# Patient Record
Sex: Male | Born: 1980
Health system: Southern US, Community
[De-identification: ages and names within clinical notes are randomized; demographics above are authoritative.]

## PROBLEM LIST (undated history)

## (undated) ENCOUNTER — Emergency Department (HOSPITAL_COMMUNITY): Payer: Self-pay

## (undated) DIAGNOSIS — Z21 Asymptomatic human immunodeficiency virus [HIV] infection status: Secondary | ICD-10-CM

## (undated) DIAGNOSIS — I10 Essential (primary) hypertension: Secondary | ICD-10-CM

## (undated) DIAGNOSIS — B20 Human immunodeficiency virus [HIV] disease: Secondary | ICD-10-CM

## (undated) DIAGNOSIS — I319 Disease of pericardium, unspecified: Secondary | ICD-10-CM

## (undated) DIAGNOSIS — A072 Cryptosporidiosis: Secondary | ICD-10-CM

## (undated) DIAGNOSIS — R778 Other specified abnormalities of plasma proteins: Secondary | ICD-10-CM

## (undated) DIAGNOSIS — R7989 Other specified abnormal findings of blood chemistry: Secondary | ICD-10-CM

## (undated) DIAGNOSIS — M1990 Unspecified osteoarthritis, unspecified site: Secondary | ICD-10-CM

## (undated) HISTORY — DX: Disease of pericardium, unspecified: I31.9

## (undated) HISTORY — DX: Human immunodeficiency virus (HIV) disease: B20

## (undated) HISTORY — DX: Unspecified osteoarthritis, unspecified site: M19.90

## (undated) HISTORY — PX: KNEE ARTHROSCOPY: SHX127

## (undated) HISTORY — DX: Other specified abnormalities of plasma proteins: R77.8

## (undated) HISTORY — DX: Cryptosporidiosis: A07.2

## (undated) HISTORY — DX: Essential (primary) hypertension: I10

## (undated) HISTORY — DX: Asymptomatic human immunodeficiency virus (hiv) infection status: Z21

## (undated) HISTORY — DX: Other specified abnormal findings of blood chemistry: R79.89

---

## 2006-01-15 ENCOUNTER — Ambulatory Visit: Payer: Self-pay | Admitting: Unknown Physician Specialty

## 2007-05-20 ENCOUNTER — Encounter: Payer: Self-pay | Admitting: Infectious Disease

## 2007-05-20 LAB — CONVERTED CEMR LAB

## 2008-02-06 ENCOUNTER — Encounter: Payer: Self-pay | Admitting: Infectious Disease

## 2008-02-06 ENCOUNTER — Ambulatory Visit: Payer: Self-pay | Admitting: Infectious Diseases

## 2008-02-06 LAB — CONVERTED CEMR LAB
CD4 Count: 394 microliters
HCV Ab: NEGATIVE
HIV 1 RNA Quant: 6490 copies/mL
RPR Ser Ql: NONREACTIVE

## 2008-06-01 ENCOUNTER — Encounter: Payer: Self-pay | Admitting: Infectious Disease

## 2008-06-01 LAB — CONVERTED CEMR LAB
CD4 Count: 299 microliters
Cholesterol: 178 mg/dL
Creatinine, Ser: 0.82 mg/dL
Glucose, Bld: 89 mg/dL
HIV 1 RNA Quant: 19120 copies/mL
Triglyceride fasting, serum: 91 mg/dL

## 2008-06-11 ENCOUNTER — Ambulatory Visit: Payer: Self-pay | Admitting: Infectious Diseases

## 2008-08-06 ENCOUNTER — Ambulatory Visit: Payer: Self-pay | Admitting: Infectious Disease

## 2008-08-06 LAB — CONVERTED CEMR LAB
CD4 T Helper %: 17.4 %
Cholesterol: 176 mg/dL
HIV 1 RNA Quant: 18810 copies/mL
Triglyceride fasting, serum: 277 mg/dL

## 2008-09-17 ENCOUNTER — Ambulatory Visit: Payer: Self-pay | Admitting: Internal Medicine

## 2008-12-14 ENCOUNTER — Encounter: Payer: Self-pay | Admitting: Infectious Disease

## 2008-12-14 LAB — CONVERTED CEMR LAB
CD4 Count: 421 microliters
CD4 T Helper %: 23.4 %
Creatinine, Ser: 0.94 mg/dL
Glucose, Bld: 107 mg/dL
Glucose, Bld: 98 mg/dL
HIV 1 RNA Quant: 290 copies/mL
Hemoglobin: 15.5 g/dL
Hemoglobin: 16.4 g/dL
Platelets: 203 10*3/uL

## 2008-12-25 ENCOUNTER — Ambulatory Visit: Payer: Self-pay | Admitting: Internal Medicine

## 2009-05-31 ENCOUNTER — Encounter: Payer: Self-pay | Admitting: Infectious Disease

## 2009-05-31 LAB — CONVERTED CEMR LAB
CD4 Count: 443 microliters
CD4 T Helper %: 21.1 %
Glucose, Bld: 93 mg/dL
HIV 1 RNA Quant: <20 copies/mL
Hemoglobin: 15.5 g/dL

## 2009-07-08 ENCOUNTER — Ambulatory Visit: Payer: Self-pay | Admitting: Infectious Diseases

## 2009-09-13 ENCOUNTER — Ambulatory Visit: Payer: Self-pay | Admitting: Internal Medicine

## 2009-11-05 ENCOUNTER — Ambulatory Visit: Payer: Self-pay | Admitting: Infectious Diseases

## 2009-11-05 ENCOUNTER — Encounter: Payer: Self-pay | Admitting: Infectious Disease

## 2009-11-05 LAB — CONVERTED CEMR LAB
CD4 Count: 448 microliters
CD4 T Helper %: 23.6 %
Glucose, Bld: 91 mg/dL
HIV 1 RNA Quant: <20 copies/mL
Hemoglobin: 15.8 g/dL

## 2010-04-25 ENCOUNTER — Encounter: Payer: Self-pay | Admitting: Infectious Disease

## 2010-04-25 LAB — CONVERTED CEMR LAB
CD4 Count: 588 microliters
CD4 T Helper %: 24.8 %
Hemoglobin: 15.6 g/dL

## 2010-06-24 ENCOUNTER — Encounter: Payer: Self-pay | Admitting: Infectious Disease

## 2010-08-15 ENCOUNTER — Other Ambulatory Visit (INDEPENDENT_AMBULATORY_CARE_PROVIDER_SITE_OTHER): Payer: Self-pay

## 2010-08-15 ENCOUNTER — Encounter: Payer: Self-pay | Admitting: Infectious Disease

## 2010-08-15 ENCOUNTER — Other Ambulatory Visit: Payer: Self-pay | Admitting: Infectious Disease

## 2010-08-15 DIAGNOSIS — B20 Human immunodeficiency virus [HIV] disease: Secondary | ICD-10-CM | POA: Insufficient documentation

## 2010-08-15 DIAGNOSIS — F4323 Adjustment disorder with mixed anxiety and depressed mood: Secondary | ICD-10-CM | POA: Insufficient documentation

## 2010-08-15 DIAGNOSIS — Z9889 Other specified postprocedural states: Secondary | ICD-10-CM | POA: Insufficient documentation

## 2010-08-15 LAB — CONVERTED CEMR LAB
ALT: 43 units/L (ref 0–53)
AST: 23 units/L (ref 0–37)
Albumin: 4.5 g/dL (ref 3.5–5.2)
Basophils Absolute: 0 10*3/uL (ref 0.0–0.1)
CO2: 27 meq/L (ref 19–32)
Calcium: 9.3 mg/dL (ref 8.4–10.5)
Chlamydia, Swab/Urine, PCR: NEGATIVE
Chloride: 103 meq/L (ref 96–112)
Cholesterol: 191 mg/dL (ref 0–200)
Creatinine, Ser: 1.03 mg/dL (ref 0.40–1.50)
Eosinophils Relative: 1 % (ref 0–5)
HCT: 45.1 % (ref 39.0–52.0)
HDL: 52 mg/dL (ref >39)
Lymphocytes Relative: 26 % (ref 12–46)
Lymphs Abs: 1.6 10*3/uL (ref 0.7–4.0)
Neutro Abs: 4 10*3/uL (ref 1.7–7.7)
Neutrophils Relative %: 65 % (ref 43–77)
Platelets: 201 10*3/uL (ref 150–400)
Potassium: 4 meq/L (ref 3.5–5.3)
RDW: 12.9 % (ref 11.5–15.5)
Total CHOL/HDL Ratio: 3.7
Triglycerides: 115 mg/dL (ref <150)
VLDL: 23 mg/dL (ref 0–40)
WBC: 6.2 10*3/uL (ref 4.0–10.5)

## 2010-08-17 LAB — HIV-1 RNA QUANT-NO REFLEX-BLD: HIV-1 RNA Quant, Log: <1.3 {Log} (ref <1.30)

## 2010-09-07 ENCOUNTER — Ambulatory Visit (INDEPENDENT_AMBULATORY_CARE_PROVIDER_SITE_OTHER): Payer: PRIVATE HEALTH INSURANCE | Admitting: Infectious Disease

## 2010-09-07 DIAGNOSIS — F4323 Adjustment disorder with mixed anxiety and depressed mood: Secondary | ICD-10-CM

## 2010-09-07 DIAGNOSIS — B20 Human immunodeficiency virus [HIV] disease: Secondary | ICD-10-CM

## 2010-09-07 DIAGNOSIS — I10 Essential (primary) hypertension: Secondary | ICD-10-CM | POA: Insufficient documentation

## 2010-09-07 DIAGNOSIS — Z21 Asymptomatic human immunodeficiency virus [HIV] infection status: Secondary | ICD-10-CM

## 2010-09-07 MED ORDER — EFAVIRENZ-EMTRICITAB-TENOFOVIR 600-200-300 MG PO TABS: 1.0000 | ORAL_TABLET | Freq: Every day | ORAL | Status: DC

## 2010-09-07 MED ORDER — BENAZEPRIL HCL 10 MG PO TABS: 10.0000 mg | ORAL_TABLET | Freq: Every day | ORAL | Status: DC

## 2010-09-07 NOTE — Progress Notes (Signed)
  Subjective:    Patient ID: Hayden Hicks, male    DOB: [birth year], 30 y.o.   MRN: [id number]  HPI  Patient is 30 is a 30 year old Caucasian male with HIV that is supremely well-controlled on Atripla. His last viral load remains undetectable and his CD4 count is healthy and well over 400. He is highly compliant with his antiviral regimen has been so for many years. He is accompanied by his male partner who is HIV negative to the clinic. We reviewed safe sex practices. Reviewed data from [facility] [phone] which showed a low risk of transmission of HIV in patients who had undetectable viral loads. That being said I still warned patient against unsafe sex practices given the risk for transmission it still exists from into his partner although it is quite low. Furthermore I pointed out that the patient could be at risk for getting a secondary strain is resistant Atripla strain if he were to engage in [place] with multiple  partners. The patient otherwise is doing quite well today and had no other specific complaints. On review of his records from [place] he has had his influenza shot this year his Pneumovax is up-to-date he has had his hepatitis B vaccinations and hepatitis A vaccinations. We will be updating her record here at [place]. Review of Systems as per history of present illness otherwise 12 point review of systems is negative.    Objective:   Physical Exam    alert oriented x4 in no acute distress. HEENT normocephalic atraumatic pupils were equal and reactive to light sclera anicteric oropharynx clear dentition is healthy. His neck is supple his cardiovascular exam revealed regular rate and rhythm no murmurs rales rubs heard lungs are clear to auscultation bilaterally without wheezes or rales his abdomen is soft nondistended nontender. Extremities are without edema. Neurologically as a nonfocal exam. No rashes.    Assessment & Plan:  HIV INFECTION Supreme is well-controlled continue Atripla.  ADJ  DISORDER WITH MIXED ANXIETY & DEPRESSED MOOD He uses Klonopin intermittently for this.  HTN (hypertension) I refilled his Lotensin.

## 2010-09-07 NOTE — Patient Instructions (Signed)
Excellent JOB!! RTC in October with labs (fasting) and visit for flu shot and MD visit

## 2010-09-07 NOTE — Assessment & Plan Note (Signed)
He uses Klonopin intermittently for this.

## 2010-09-07 NOTE — Assessment & Plan Note (Signed)
Supreme is well-controlled continue Atripla.

## 2010-09-07 NOTE — Assessment & Plan Note (Signed)
I refilled his Lotensin.

## 2010-09-13 ENCOUNTER — Encounter: Payer: Self-pay | Admitting: Licensed Clinical Social Worker

## 2010-10-13 ENCOUNTER — Encounter: Payer: Self-pay | Admitting: Infectious Disease

## 2010-10-13 ENCOUNTER — Encounter: Payer: Self-pay | Admitting: Internal Medicine

## 2010-10-13 ENCOUNTER — Encounter: Payer: Self-pay | Admitting: Infectious Diseases

## 2011-02-06 ENCOUNTER — Other Ambulatory Visit (INDEPENDENT_AMBULATORY_CARE_PROVIDER_SITE_OTHER): Payer: PRIVATE HEALTH INSURANCE

## 2011-02-06 DIAGNOSIS — Z23 Encounter for immunization: Secondary | ICD-10-CM

## 2011-02-06 DIAGNOSIS — Z113 Encounter for screening for infections with a predominantly sexual mode of transmission: Secondary | ICD-10-CM

## 2011-02-06 DIAGNOSIS — Z79899 Other long term (current) drug therapy: Secondary | ICD-10-CM

## 2011-02-06 DIAGNOSIS — Z21 Asymptomatic human immunodeficiency virus [HIV] infection status: Secondary | ICD-10-CM

## 2011-02-06 DIAGNOSIS — B20 Human immunodeficiency virus [HIV] disease: Secondary | ICD-10-CM

## 2011-02-06 LAB — COMPREHENSIVE METABOLIC PANEL
AST: 37 U/L (ref 0–37)
BUN: 14 mg/dL (ref 6–23)
Calcium: 10 mg/dL (ref 8.4–10.5)
Chloride: 98 mEq/L (ref 96–112)
Creat: 1.07 mg/dL (ref 0.50–1.35)

## 2011-02-06 LAB — CBC WITH DIFFERENTIAL/PLATELET
Basophils Absolute: 0 10*3/uL (ref 0.0–0.1)
Eosinophils Relative: 2 % (ref 0–5)
HCT: 50.4 % (ref 39.0–52.0)
Lymphocytes Relative: 36 % (ref 12–46)
MCH: 32.7 pg (ref 26.0–34.0)
MCHC: 36.1 g/dL — ABNORMAL HIGH (ref 30.0–36.0)
MCV: 90.5 fL (ref 78.0–100.0)
Monocytes Absolute: 0.5 10*3/uL (ref 0.1–1.0)
RDW: 12.5 % (ref 11.5–15.5)
WBC: 7 10*3/uL (ref 4.0–10.5)

## 2011-02-06 LAB — RPR

## 2011-02-07 LAB — T-HELPER CELL (CD4) - (RCID CLINIC ONLY)
CD4 % Helper T Cell: 31 % — ABNORMAL LOW (ref 33–55)
CD4 T Cell Abs: 820 uL (ref 400–2700)

## 2011-02-07 LAB — GC PROBE AMPLIFICATION, URINE: GC Probe Amp, Urine: NEGATIVE

## 2011-02-08 LAB — HIV-1 RNA QUANT-NO REFLEX-BLD: HIV 1 RNA Quant: 30 copies/mL — ABNORMAL HIGH (ref <20)

## 2011-02-20 ENCOUNTER — Ambulatory Visit (INDEPENDENT_AMBULATORY_CARE_PROVIDER_SITE_OTHER): Payer: PRIVATE HEALTH INSURANCE | Admitting: Infectious Disease

## 2011-02-20 ENCOUNTER — Encounter: Payer: Self-pay | Admitting: Infectious Disease

## 2011-02-20 VITALS — BP 166/97 | HR 77 | Temp 98.0°F | Wt 202.0 lb

## 2011-02-20 DIAGNOSIS — I10 Essential (primary) hypertension: Secondary | ICD-10-CM

## 2011-02-20 DIAGNOSIS — R6889 Other general symptoms and signs: Secondary | ICD-10-CM

## 2011-02-20 DIAGNOSIS — F4323 Adjustment disorder with mixed anxiety and depressed mood: Secondary | ICD-10-CM

## 2011-02-20 DIAGNOSIS — D751 Secondary polycythemia: Secondary | ICD-10-CM

## 2011-02-20 DIAGNOSIS — B20 Human immunodeficiency virus [HIV] disease: Secondary | ICD-10-CM

## 2011-02-20 DIAGNOSIS — R196 Halitosis: Secondary | ICD-10-CM

## 2011-02-20 LAB — MICROALBUMIN / CREATININE URINE RATIO
Creatinine, Urine: 243.2 mg/dL
Microalb Creat Ratio: 30.6 mg/g — ABNORMAL HIGH (ref 0.0–30.0)
Microalb, Ur: 7.45 mg/dL — ABNORMAL HIGH (ref 0.00–1.89)

## 2011-02-20 LAB — HEMOGLOBIN A1C: Mean Plasma Glucose: 105 mg/dL (ref <117)

## 2011-02-20 MED ORDER — AMLODIPINE BESY-BENAZEPRIL HCL 5-10 MG PO CAPS: 1.0000 | ORAL_CAPSULE | Freq: Every day | ORAL | Status: DC

## 2011-02-20 NOTE — Progress Notes (Signed)
Subjective:    Patient ID: Hayden Hicks, male    DOB: [birth year], 30 y.o.   MRN: [id number]  HPI Hayden Hicks is a 30 year old Caucasian male HIV the supreme is well-controlled with Atripla. Accompanied today by his male partner who is HIV negative per history. They endorse using condoms with all forms of sexual intercourse. We spent a great deal of time discussing and multitude of issues today.  #1 patient complains of flulike symptoms after having his influenza vaccine he had symptoms of myalgias fevers malaise and some coarse voice. I informed him that this possibly could have been related to his body showing an immune response to the inactivated flu virus antigens or it could be a coincidental upper respiratory viral infection that is now resolved. Encouraged him to continue getting his flu shots.  #2 we examined his labs and found that he had elevated hemoglobin 18. This is isolated he is not smoking he is on testosterone replacement workup we will recheck these labs.  #3 review his blood pressure which was in the 160s over 90s range I'm going to change him to amlodipine/benazepril.  #4 review his cholesterol and had been not fasting today so able to keep this in mind in terms of interpreting the data.  #5 he is at some bills and into the clinic was related to time period covered by a And didn't have insurance was referred to   In total we spent greater than 45 minutes greater than 50% of time in face-to-face counseling the patient and coordination of his care.   Review of Systems  Constitutional: Negative for fever, chills, diaphoresis, activity change, appetite change, fatigue and unexpected weight change.  HENT: Negative for congestion, sore throat, rhinorrhea, sneezing, trouble swallowing and sinus pressure.   Eyes: Negative for photophobia and visual disturbance.  Respiratory: Negative for cough, chest tightness, shortness of breath, wheezing and stridor.   Cardiovascular:  Negative for chest pain, palpitations and leg swelling.  Gastrointestinal: Negative for nausea, vomiting, abdominal pain, diarrhea, constipation, blood in stool, abdominal distention and anal bleeding.  Genitourinary: Negative for dysuria, hematuria, flank pain and difficulty urinating.  Musculoskeletal: Negative for myalgias, back pain, joint swelling, arthralgias and gait problem.  Skin: Negative for color change, pallor, rash and wound.  Neurological: Negative for dizziness, tremors, weakness and light-headedness.  Hematological: Negative for adenopathy. Does not bruise/bleed easily.  Psychiatric/Behavioral: Negative for behavioral problems, confusion, sleep disturbance, dysphoric mood, decreased concentration and agitation.       Objective:   Physical Exam  Constitutional: He is oriented to person, place, and time. He appears well-developed and well-nourished. No distress.  HENT:  Head: Normocephalic and atraumatic.  Mouth/Throat: Oropharynx is clear and moist. No oropharyngeal exudate.  Eyes: Conjunctivae and EOM are normal. Pupils are equal, round, and reactive to light. No scleral icterus.  Neck: Normal range of motion. Neck supple. No JVD present.  Cardiovascular: Normal rate, regular rhythm and normal heart sounds.  Exam reveals no gallop and no friction rub.   No murmur heard. Pulmonary/Chest: Effort normal and breath sounds normal. No respiratory distress. He has no wheezes. He has no rales. He exhibits no tenderness.  Abdominal: He exhibits no distension and no mass. There is no tenderness. There is no rebound and no guarding.  Musculoskeletal: He exhibits no edema and no tenderness.  Lymphadenopathy:    He has no cervical adenopathy.  Neurological: He is alert and oriented to person, place, and time. He has normal reflexes. He exhibits normal  muscle tone. Coordination normal.  Skin: Skin is warm and dry. He is not diaphoretic. No erythema. No pallor.  Psychiatric: He has a  normal mood and affect. His behavior is normal. Judgment and thought content normal.          Assessment & Plan:  HIV INFECTION Superb control continue Atripla we'll check an [id number] will be checked as well  HTN (hypertension) Not well controlled change to lotrel. Check his microalbumin and creatinine ratio  Polycythemia Only one isolated blood test with high hgb. No smoking no testosteroen replacement. WIll recheck labs today  ADJ DISORDER WITH MIXED ANXIETY & DEPRESSED MOOD stable  Bad breath No new caries on exam but should see dentist in any case  Flu-like symptoms Could have been related to influenza vaccine more likely coincidental URI

## 2011-02-20 NOTE — Assessment & Plan Note (Signed)
Could have been related to influenza vaccine more likely coincidental URI

## 2011-02-20 NOTE — Assessment & Plan Note (Signed)
No new caries on exam but should see dentist in any case

## 2011-02-20 NOTE — Assessment & Plan Note (Signed)
Superb control continue Atripla we'll check an HLA-[id number] will be checked as well

## 2011-02-20 NOTE — Assessment & Plan Note (Signed)
Only one isolated blood test with high hgb. No smoking no testosteroen replacement. WIll recheck labs today

## 2011-02-20 NOTE — Assessment & Plan Note (Addendum)
Not well controlled change to lotrel. Check his microalbumin and creatinine ratio

## 2011-02-20 NOTE — Assessment & Plan Note (Signed)
stable °

## 2011-03-01 LAB — HLA B*5701: HLA-B*5701: NEGATIVE

## 2011-08-09 ENCOUNTER — Other Ambulatory Visit: Payer: PRIVATE HEALTH INSURANCE

## 2011-08-09 DIAGNOSIS — B20 Human immunodeficiency virus [HIV] disease: Secondary | ICD-10-CM

## 2011-08-09 LAB — COMPLETE METABOLIC PANEL WITH GFR
ALT: 44 U/L (ref 0–53)
AST: 26 U/L (ref 0–37)
Calcium: 9.4 mg/dL (ref 8.4–10.5)
Chloride: 103 mEq/L (ref 96–112)
Creat: 0.98 mg/dL (ref 0.50–1.35)
Potassium: 3.8 mEq/L (ref 3.5–5.3)
Sodium: 140 mEq/L (ref 135–145)
Total Protein: 6.9 g/dL (ref 6.0–8.3)

## 2011-08-09 LAB — CBC WITH DIFFERENTIAL/PLATELET
Eosinophils Absolute: 0.2 10*3/uL (ref 0.0–0.7)
Eosinophils Relative: 3 % (ref 0–5)
Lymphs Abs: 3 10*3/uL (ref 0.7–4.0)
MCH: 32.6 pg (ref 26.0–34.0)
MCV: 89.7 fL (ref 78.0–100.0)
Monocytes Relative: 7 % (ref 3–12)
Platelets: 201 10*3/uL (ref 150–400)
RBC: 5.15 MIL/uL (ref 4.22–5.81)

## 2011-08-11 LAB — HIV-1 RNA ULTRAQUANT REFLEX TO GENTYP+
HIV 1 RNA Quant: <20 copies/mL (ref <20)
HIV-1 RNA Quant, Log: <1.3 {Log} (ref <1.30)

## 2011-08-23 ENCOUNTER — Ambulatory Visit (INDEPENDENT_AMBULATORY_CARE_PROVIDER_SITE_OTHER): Payer: PRIVATE HEALTH INSURANCE | Admitting: Infectious Disease

## 2011-08-23 ENCOUNTER — Encounter: Payer: Self-pay | Admitting: Infectious Disease

## 2011-08-23 VITALS — BP 139/88 | HR 84 | Temp 98.3°F | Wt 195.0 lb

## 2011-08-23 DIAGNOSIS — B20 Human immunodeficiency virus [HIV] disease: Secondary | ICD-10-CM

## 2011-08-23 DIAGNOSIS — R196 Halitosis: Secondary | ICD-10-CM

## 2011-08-23 DIAGNOSIS — I10 Essential (primary) hypertension: Secondary | ICD-10-CM

## 2011-08-23 DIAGNOSIS — R6889 Other general symptoms and signs: Secondary | ICD-10-CM

## 2011-08-23 DIAGNOSIS — Z0000 Encounter for general adult medical examination without abnormal findings: Secondary | ICD-10-CM

## 2011-08-23 NOTE — Assessment & Plan Note (Signed)
Performed and form filled out for the pt

## 2011-08-23 NOTE — Assessment & Plan Note (Signed)
Perfect control 

## 2011-08-23 NOTE — Assessment & Plan Note (Signed)
Partner complained of pt having problems with gas today. Can try to alter diet and make  Sure dental checkups todate.

## 2011-08-23 NOTE — Progress Notes (Signed)
  Subjective:    Patient ID: Hayden Hicks, male    DOB: [birth year], 31 y.o.   MRN: [id number]  HPI  Hayden Hicks is a 31 y.o. male who is doing superbly well on his  antiviral regimen, with undetectable viral load and health cd4 count that is now near 1000. His BP is better controlled. Lipids were not rechecked after trigylcerides were high with NON fasting lipid panel. He presents here with his partner. He is in need of an annual physical for work.Review of Systems  Constitutional: Negative for fever, chills, diaphoresis, activity change, appetite change, fatigue and unexpected weight change.  HENT: Negative for congestion, sore throat, rhinorrhea, sneezing, trouble swallowing and sinus pressure.   Eyes: Negative for photophobia and visual disturbance.  Respiratory: Negative for cough, chest tightness, shortness of breath, wheezing and stridor.   Cardiovascular: Negative for chest pain, palpitations and leg swelling.  Gastrointestinal: Negative for nausea, vomiting, abdominal pain, diarrhea, constipation, blood in stool, abdominal distention and anal bleeding.  Genitourinary: Negative for dysuria, hematuria, flank pain and difficulty urinating.  Musculoskeletal: Negative for myalgias, back pain, joint swelling, arthralgias and gait problem.  Skin: Negative for color change, pallor, rash and wound.  Neurological: Negative for dizziness, tremors, weakness and light-headedness.  Hematological: Negative for adenopathy. Does not bruise/bleed easily.  Psychiatric/Behavioral: Negative for behavioral problems, confusion, sleep disturbance, dysphoric mood, decreased concentration and agitation.       Objective:   Physical Exam  Constitutional: He is oriented to person, place, and time. He appears well-developed and well-nourished. No distress.  HENT:  Head: Normocephalic and atraumatic.  Mouth/Throat: Oropharynx is clear and moist. No oropharyngeal exudate.  Eyes: Conjunctivae and EOM are  normal. Pupils are equal, round, and reactive to light. No scleral icterus.  Neck: Normal range of motion. Neck supple. No JVD present.  Cardiovascular: Normal rate, regular rhythm and normal heart sounds.  Exam reveals no gallop and no friction rub.   No murmur heard. Pulmonary/Chest: Effort normal and breath sounds normal. No respiratory distress. He has no wheezes. He has no rales. He exhibits no tenderness.  Abdominal: He exhibits no distension and no mass. There is no tenderness. There is no rebound and no guarding.  Musculoskeletal: He exhibits no edema and no tenderness.  Lymphadenopathy:       Head (right side): No submental, no submandibular, no preauricular and no posterior auricular adenopathy present.       Head (left side): No submental, no submandibular, no preauricular and no posterior auricular adenopathy present.    He has no cervical adenopathy.    He has no axillary adenopathy.  Neurological: He is alert and oriented to person, place, and time. He has normal reflexes. No cranial nerve deficit or sensory deficit. He exhibits normal muscle tone. Coordination normal.  Skin: Skin is warm and dry. No abrasion, no bruising and no rash noted. He is not diaphoretic. No cyanosis or erythema. No pallor. Nails show no clubbing.  Psychiatric: He has a normal mood and affect. His behavior is normal. Judgment and thought content normal.          Assessment & Plan:  HIV INFECTION Perfect control  HTN (hypertension) Better control on lotrel  Bad breath Partner complained of pt having problems with gas today. Can try to alter diet and make  Sure dental checkups todate.  Annual physical exam Performed and form filled out for the pt

## 2011-08-23 NOTE — Assessment & Plan Note (Signed)
Better control on lotrel

## 2011-09-15 ENCOUNTER — Other Ambulatory Visit: Payer: Self-pay | Admitting: Licensed Clinical Social Worker

## 2011-09-15 ENCOUNTER — Other Ambulatory Visit: Payer: Self-pay | Admitting: Infectious Disease

## 2011-09-15 DIAGNOSIS — B20 Human immunodeficiency virus [HIV] disease: Secondary | ICD-10-CM

## 2011-09-15 MED ORDER — EFAVIRENZ-EMTRICITAB-TENOFOVIR 600-200-300 MG PO TABS: 1.0000 | ORAL_TABLET | Freq: Every day | ORAL | Status: DC

## 2012-01-08 ENCOUNTER — Other Ambulatory Visit: Payer: Self-pay | Admitting: Licensed Clinical Social Worker

## 2012-01-08 DIAGNOSIS — I10 Essential (primary) hypertension: Secondary | ICD-10-CM

## 2012-01-08 MED ORDER — AMLODIPINE BESY-BENAZEPRIL HCL 5-10 MG PO CAPS: 1.0000 | ORAL_CAPSULE | Freq: Every day | ORAL | Status: DC

## 2012-02-05 ENCOUNTER — Other Ambulatory Visit: Payer: Self-pay | Admitting: Infectious Disease

## 2012-02-05 DIAGNOSIS — Z113 Encounter for screening for infections with a predominantly sexual mode of transmission: Secondary | ICD-10-CM

## 2012-02-07 ENCOUNTER — Other Ambulatory Visit: Payer: PRIVATE HEALTH INSURANCE

## 2012-02-21 ENCOUNTER — Ambulatory Visit: Payer: PRIVATE HEALTH INSURANCE | Admitting: Infectious Disease

## 2012-02-21 ENCOUNTER — Other Ambulatory Visit (INDEPENDENT_AMBULATORY_CARE_PROVIDER_SITE_OTHER): Payer: PRIVATE HEALTH INSURANCE

## 2012-02-21 DIAGNOSIS — B20 Human immunodeficiency virus [HIV] disease: Secondary | ICD-10-CM

## 2012-02-21 DIAGNOSIS — Z113 Encounter for screening for infections with a predominantly sexual mode of transmission: Secondary | ICD-10-CM

## 2012-02-21 LAB — CBC WITH DIFFERENTIAL/PLATELET
Basophils Absolute: 0 10*3/uL (ref 0.0–0.1)
Eosinophils Absolute: 0.2 10*3/uL (ref 0.0–0.7)
Eosinophils Relative: 2 % (ref 0–5)
MCH: 32.2 pg (ref 26.0–34.0)
MCV: 89.6 fL (ref 78.0–100.0)
Platelets: 231 10*3/uL (ref 150–400)
RDW: 12.6 % (ref 11.5–15.5)

## 2012-02-21 LAB — COMPLETE METABOLIC PANEL WITH GFR
Albumin: 4.4 g/dL (ref 3.5–5.2)
CO2: 28 mEq/L (ref 19–32)
GFR, Est African American: >89 mL/min
GFR, Est Non African American: >89 mL/min
Glucose, Bld: 88 mg/dL (ref 70–99)
Potassium: 4.3 mEq/L (ref 3.5–5.3)
Sodium: 139 mEq/L (ref 135–145)
Total Protein: 6.8 g/dL (ref 6.0–8.3)

## 2012-02-21 LAB — LIPID PANEL: Total CHOL/HDL Ratio: 3.9 Ratio

## 2012-02-22 LAB — T-HELPER CELL (CD4) - (RCID CLINIC ONLY)
CD4 % Helper T Cell: 35 % (ref 33–55)
CD4 T Cell Abs: 990 uL (ref 400–2700)

## 2012-03-18 ENCOUNTER — Ambulatory Visit (INDEPENDENT_AMBULATORY_CARE_PROVIDER_SITE_OTHER): Payer: PRIVATE HEALTH INSURANCE | Admitting: Infectious Disease

## 2012-03-18 ENCOUNTER — Encounter: Payer: Self-pay | Admitting: Infectious Disease

## 2012-03-18 VITALS — BP 138/91 | HR 82 | Temp 98.0°F | Ht 70.0 in | Wt 190.0 lb

## 2012-03-18 DIAGNOSIS — I10 Essential (primary) hypertension: Secondary | ICD-10-CM

## 2012-03-18 DIAGNOSIS — B20 Human immunodeficiency virus [HIV] disease: Secondary | ICD-10-CM

## 2012-03-18 MED ORDER — AMLODIPINE BESY-BENAZEPRIL HCL 5-10 MG PO CAPS: 1.0000 | ORAL_CAPSULE | Freq: Every day | ORAL | Status: DC

## 2012-03-18 MED ORDER — EFAVIRENZ-EMTRICITAB-TENOFOVIR 600-200-300 MG PO TABS: 1.0000 | ORAL_TABLET | Freq: Every day | ORAL | Status: DC

## 2012-03-18 NOTE — Progress Notes (Signed)
  Subjective:    Patient ID: [name], male    DOB: 02-Oct-1980, 31 y.o.   MRN: [id number]  HPI  Hayden Hicks is a 31 y.o. male who is doing superbly well on his  antiviral regimen, ATRIPLA  with undetectable viral load and health cd4 count. His BP is still not optimally controlled. He needs recheck today and recheck microalbumin to creatinine ratio. Otherwise he has no complaints.    Review of Systems  Constitutional: Negative for fever, chills, diaphoresis, activity change, appetite change, fatigue and unexpected weight change.  HENT: Negative for congestion, sore throat, rhinorrhea, sneezing, trouble swallowing and sinus pressure.   Eyes: Negative for photophobia and visual disturbance.  Respiratory: Negative for cough, chest tightness, shortness of breath, wheezing and stridor.   Cardiovascular: Negative for chest pain, palpitations and leg swelling.  Gastrointestinal: Negative for nausea, vomiting, abdominal pain, diarrhea, constipation, blood in stool, abdominal distention and anal bleeding.  Genitourinary: Negative for dysuria, hematuria, flank pain and difficulty urinating.  Musculoskeletal: Negative for myalgias, back pain, joint swelling, arthralgias and gait problem.  Skin: Negative for color change, pallor, rash and wound.  Neurological: Negative for dizziness, tremors, weakness and light-headedness.  Hematological: Negative for adenopathy. Does not bruise/bleed easily.  Psychiatric/Behavioral: Negative for behavioral problems, confusion, disturbed wake/sleep cycle, dysphoric mood, decreased concentration and agitation.       Objective:   Physical Exam  Constitutional: He is oriented to person, place, and time. He appears well-developed and well-nourished. No distress.  HENT:  Head: Normocephalic and atraumatic.  Mouth/Throat: Oropharynx is clear and moist. No oropharyngeal exudate.  Eyes: Conjunctivae normal and EOM are normal. Pupils are equal, round, and reactive  to light. No scleral icterus.  Neck: Normal range of motion. Neck supple. No JVD present.  Cardiovascular: Normal rate, regular rhythm and normal heart sounds.  Exam reveals no gallop and no friction rub.   No murmur heard. Pulmonary/Chest: Effort normal and breath sounds normal. No respiratory distress. He has no wheezes. He has no rales. He exhibits no tenderness.  Abdominal: He exhibits no distension and no mass. There is no tenderness. There is no rebound and no guarding.  Musculoskeletal: He exhibits no edema and no tenderness.  Lymphadenopathy:    He has no cervical adenopathy.  Neurological: He is alert and oriented to person, place, and time. He has normal reflexes. He exhibits normal muscle tone. Coordination normal.  Skin: Skin is warm and dry. He is not diaphoretic. No erythema. No pallor.  Psychiatric: He has a normal mood and affect. His behavior is normal. Judgment and thought content normal.          Assessment & Plan:  HIV: continue Atripla  HTN: recheck bp and check microalbumin to creatine ratio

## 2012-03-19 LAB — MICROALBUMIN / CREATININE URINE RATIO: Microalb, Ur: 0.82 mg/dL (ref 0.00–1.89)

## 2012-04-09 ENCOUNTER — Telehealth: Payer: Self-pay | Admitting: Licensed Clinical Social Worker

## 2012-04-09 DIAGNOSIS — F419 Anxiety disorder, unspecified: Secondary | ICD-10-CM

## 2012-04-09 NOTE — Telephone Encounter (Signed)
Twice daily.

## 2012-04-09 NOTE — Telephone Encounter (Signed)
Patient would like  Klonopin increased he is currently taking 0.5 and he takes 2 or more daily due to increase stress at work. Please advise

## 2012-04-09 NOTE — Telephone Encounter (Signed)
Is he taking pills twice daily or two pills once daily?

## 2012-04-10 ENCOUNTER — Other Ambulatory Visit: Payer: Self-pay | Admitting: Internal Medicine

## 2012-04-10 MED ORDER — CLONAZEPAM 1 MG PO TABS: 1.0000 mg | ORAL_TABLET | Freq: Every day | ORAL | Status: DC | PRN

## 2012-04-10 NOTE — Telephone Encounter (Signed)
RN spoke with Dr. Daiva Eves.  Obtained order to increase Klonopin from 0.5 mg tablet to 1.0 mg tablet / day, #30 w/ 4 refills.  Phone call to pt to let him know about dosage change and refill called to pharmacy.

## 2012-09-04 ENCOUNTER — Other Ambulatory Visit: Payer: Self-pay | Admitting: Infectious Disease

## 2012-09-04 ENCOUNTER — Other Ambulatory Visit: Payer: PRIVATE HEALTH INSURANCE

## 2012-09-04 DIAGNOSIS — B20 Human immunodeficiency virus [HIV] disease: Secondary | ICD-10-CM

## 2012-09-04 LAB — CBC WITH DIFFERENTIAL/PLATELET
Eosinophils Relative: 2 % (ref 0–5)
HCT: 49.2 % (ref 39.0–52.0)
Hemoglobin: 16.9 g/dL (ref 13.0–17.0)
Lymphocytes Relative: 32 % (ref 12–46)
Lymphs Abs: 2.5 10*3/uL (ref 0.7–4.0)
MCH: 30.7 pg (ref 26.0–34.0)
MCV: 89.3 fL (ref 78.0–100.0)
Monocytes Absolute: 0.9 10*3/uL (ref 0.1–1.0)
Monocytes Relative: 11 % (ref 3–12)
RBC: 5.51 MIL/uL (ref 4.22–5.81)
WBC: 7.7 10*3/uL (ref 4.0–10.5)

## 2012-09-04 LAB — COMPLETE METABOLIC PANEL WITH GFR
ALT: 37 U/L (ref 0–53)
AST: 18 U/L (ref 0–37)
Alkaline Phosphatase: 76 U/L (ref 39–117)
BUN: 19 mg/dL (ref 6–23)
Creat: 1.23 mg/dL (ref 0.50–1.35)

## 2012-09-04 LAB — LIPID PANEL
HDL: 52 mg/dL (ref >39)
LDL Cholesterol: 94 mg/dL (ref 0–99)
Total CHOL/HDL Ratio: 3.5 Ratio
VLDL: 35 mg/dL (ref 0–40)

## 2012-09-05 LAB — T-HELPER CELL (CD4) - (RCID CLINIC ONLY): CD4 T Cell Abs: 670 uL (ref 400–2700)

## 2012-09-05 LAB — RPR

## 2012-09-06 LAB — HIV-1 RNA QUANT-NO REFLEX-BLD
HIV 1 RNA Quant: <20 copies/mL (ref <20)
HIV-1 RNA Quant, Log: <1.3 {Log} (ref <1.30)

## 2012-09-18 ENCOUNTER — Ambulatory Visit (INDEPENDENT_AMBULATORY_CARE_PROVIDER_SITE_OTHER): Payer: PRIVATE HEALTH INSURANCE | Admitting: Infectious Disease

## 2012-09-18 ENCOUNTER — Encounter: Payer: Self-pay | Admitting: Infectious Disease

## 2012-09-18 VITALS — BP 145/84 | HR 89 | Temp 97.4°F | Ht 69.0 in | Wt 191.0 lb

## 2012-09-18 DIAGNOSIS — B20 Human immunodeficiency virus [HIV] disease: Secondary | ICD-10-CM

## 2012-09-18 DIAGNOSIS — I10 Essential (primary) hypertension: Secondary | ICD-10-CM

## 2012-09-18 NOTE — Progress Notes (Signed)
  Subjective:    Patient ID: Hayden Hicks, male    DOB: 11-Feb-1981, 32 y.o.   MRN: [id number]  HPI   Hayden Hicks is a 32 y.o. male who is doing superbly well on his  antiviral regimen, ATRIPLA  with undetectable viral load and health cd4 count. His BP is up a bit but  microablumin to creatiine ratio was better.   He would be candidate for [facility] and I proposed this to him today. He refused mychart enrollment because his computer always is crashing.   Review of Systems  Constitutional: Negative for fever, chills, diaphoresis, activity change, appetite change, fatigue and unexpected weight change.  HENT: Negative for congestion, sore throat, rhinorrhea, sneezing, trouble swallowing and sinus pressure.   Eyes: Negative for photophobia and visual disturbance.  Respiratory: Negative for cough, chest tightness, shortness of breath, wheezing and stridor.   Cardiovascular: Negative for chest pain, palpitations and leg swelling.  Gastrointestinal: Negative for nausea, vomiting, abdominal pain, diarrhea, constipation, blood in stool, abdominal distention and anal bleeding.  Genitourinary: Negative for dysuria, hematuria, flank pain and difficulty urinating.  Musculoskeletal: Negative for myalgias, back pain, joint swelling, arthralgias and gait problem.  Skin: Negative for color change, pallor, rash and wound.  Neurological: Negative for dizziness, tremors, weakness and light-headedness.  Hematological: Negative for adenopathy. Does not bruise/bleed easily.  Psychiatric/Behavioral: Negative for behavioral problems, confusion, sleep disturbance, dysphoric mood, decreased concentration and agitation.       Objective:   Physical Exam  Constitutional: He is oriented to person, place, and time. He appears well-developed and well-nourished. No distress.  HENT:  Head: Normocephalic and atraumatic.  Mouth/Throat: Oropharynx is clear and moist. No oropharyngeal exudate.  Eyes: Conjunctivae and  EOM are normal. Pupils are equal, round, and reactive to light. No scleral icterus.  Neck: Normal range of motion. Neck supple. No JVD present.  Cardiovascular: Normal rate, regular rhythm and normal heart sounds.  Exam reveals no gallop and no friction rub.   No murmur heard. Pulmonary/Chest: Effort normal and breath sounds normal. No respiratory distress. He has no wheezes. He has no rales. He exhibits no tenderness.  Abdominal: He exhibits no distension and no mass. There is no tenderness. There is no rebound and no guarding.  Musculoskeletal: He exhibits no edema and no tenderness.  Lymphadenopathy:    He has no cervical adenopathy.  Neurological: He is alert and oriented to person, place, and time. He has normal reflexes. He exhibits normal muscle tone. Coordination normal.  Skin: Skin is warm and dry. He is not diaphoretic. No erythema. No pallor.  Psychiatric: He has a normal mood and affect. His behavior is normal. Judgment and thought content normal.          Assessment & Plan:  HIV: continue Atripla, refer as  Possible Mychart candidate  HTN: recheck  microalbumin to creatine ratio

## 2012-09-19 ENCOUNTER — Telehealth: Payer: Self-pay | Admitting: Infectious Disease

## 2012-09-19 DIAGNOSIS — I10 Essential (primary) hypertension: Secondary | ICD-10-CM

## 2012-09-19 LAB — MICROALBUMIN / CREATININE URINE RATIO
Creatinine, Urine: 101.6 mg/dL
Microalb, Ur: 4.61 mg/dL — ABNORMAL HIGH (ref 0.00–1.89)

## 2012-09-19 MED ORDER — AMLODIPINE BESY-BENAZEPRIL HCL 5-20 MG PO CAPS: 1.0000 | ORAL_CAPSULE | Freq: Every day | ORAL | Status: DC

## 2012-09-19 NOTE — Telephone Encounter (Signed)
Hayden Hicks's microalbumin to creatinine is up and he needs his lotrel dose increased  I have sent in new rx in epic  He needs to pick thisup start taking it and have a BMP and BP check a week after starting it

## 2012-11-13 ENCOUNTER — Telehealth: Payer: Self-pay

## 2012-11-13 DIAGNOSIS — F419 Anxiety disorder, unspecified: Secondary | ICD-10-CM

## 2012-11-13 MED ORDER — CLONAZEPAM 1 MG PO TABS: 1.0000 mg | ORAL_TABLET | Freq: Every day | ORAL | Status: DC | PRN

## 2012-11-13 NOTE — Telephone Encounter (Signed)
Medication refill request.

## 2013-01-31 ENCOUNTER — Other Ambulatory Visit: Payer: Self-pay | Admitting: Infectious Disease

## 2013-02-03 ENCOUNTER — Other Ambulatory Visit: Payer: Self-pay | Admitting: Infectious Disease

## 2013-02-03 DIAGNOSIS — I10 Essential (primary) hypertension: Secondary | ICD-10-CM

## 2013-02-05 ENCOUNTER — Other Ambulatory Visit: Payer: PRIVATE HEALTH INSURANCE

## 2013-02-05 DIAGNOSIS — B20 Human immunodeficiency virus [HIV] disease: Secondary | ICD-10-CM

## 2013-02-05 LAB — CBC WITH DIFFERENTIAL/PLATELET
Hemoglobin: 17.1 g/dL — ABNORMAL HIGH (ref 13.0–17.0)
Lymphocytes Relative: 38 % (ref 12–46)
Lymphs Abs: 2.3 10*3/uL (ref 0.7–4.0)
Neutro Abs: 3.3 10*3/uL (ref 1.7–7.7)
Neutrophils Relative %: 53 % (ref 43–77)
Platelets: 215 10*3/uL (ref 150–400)
RBC: 5.34 MIL/uL (ref 4.22–5.81)
WBC: 6.1 10*3/uL (ref 4.0–10.5)

## 2013-02-05 LAB — COMPLETE METABOLIC PANEL WITH GFR
Albumin: 4.6 g/dL (ref 3.5–5.2)
Alkaline Phosphatase: 79 U/L (ref 39–117)
GFR, Est Non African American: >89 mL/min
Glucose, Bld: 90 mg/dL (ref 70–99)
Potassium: 4.2 mEq/L (ref 3.5–5.3)
Sodium: 140 mEq/L (ref 135–145)
Total Protein: 6.7 g/dL (ref 6.0–8.3)

## 2013-02-05 LAB — LIPID PANEL
HDL: 48 mg/dL (ref >39)
LDL Cholesterol: 88 mg/dL (ref 0–99)
Total CHOL/HDL Ratio: 3.3 Ratio

## 2013-02-06 LAB — HIV-1 RNA QUANT-NO REFLEX-BLD
HIV 1 RNA Quant: <20 copies/mL (ref <20)
HIV-1 RNA Quant, Log: <1.3 {Log} (ref <1.30)

## 2013-02-06 LAB — T-HELPER CELL (CD4) - (RCID CLINIC ONLY): CD4 T Cell Abs: 770 /uL (ref 400–2700)

## 2013-02-19 ENCOUNTER — Ambulatory Visit (INDEPENDENT_AMBULATORY_CARE_PROVIDER_SITE_OTHER): Payer: PRIVATE HEALTH INSURANCE | Admitting: Infectious Disease

## 2013-02-19 ENCOUNTER — Encounter: Payer: Self-pay | Admitting: Infectious Disease

## 2013-02-19 VITALS — BP 136/85 | HR 75 | Temp 98.3°F | Wt 188.0 lb

## 2013-02-19 DIAGNOSIS — B20 Human immunodeficiency virus [HIV] disease: Secondary | ICD-10-CM

## 2013-02-19 DIAGNOSIS — I10 Essential (primary) hypertension: Secondary | ICD-10-CM

## 2013-02-19 DIAGNOSIS — Z23 Encounter for immunization: Secondary | ICD-10-CM

## 2013-02-19 MED ORDER — AMLODIPINE BESY-BENAZEPRIL HCL 5-40 MG PO CAPS: 1.0000 | ORAL_CAPSULE | Freq: Every day | ORAL | Status: DC

## 2013-02-19 NOTE — Progress Notes (Signed)
  Subjective:    Patient ID: Hayden Hicks, male    DOB: August 18, 1980, 32 y.o.   MRN: [id number]  HPI   Hayden Hicks is a 32 y.o. male who is doing superbly well on his  antiviral regimen, ATRIPLA  with undetectable viral load and health cd4 count. His BP has remained elevated.  I will increase ACEI portion of his bp med.    Review of Systems  Constitutional: Negative for fever, chills, diaphoresis, activity change, appetite change, fatigue and unexpected weight change.  HENT: Negative for congestion, sore throat, rhinorrhea, sneezing, trouble swallowing and sinus pressure.   Eyes: Negative for photophobia and visual disturbance.  Respiratory: Negative for cough, chest tightness, shortness of breath, wheezing and stridor.   Cardiovascular: Negative for chest pain, palpitations and leg swelling.  Gastrointestinal: Negative for nausea, vomiting, abdominal pain, diarrhea, constipation, blood in stool, abdominal distention and anal bleeding.  Genitourinary: Negative for dysuria, hematuria, flank pain and difficulty urinating.  Musculoskeletal: Negative for myalgias, back pain, joint swelling, arthralgias and gait problem.  Skin: Negative for color change, pallor, rash and wound.  Neurological: Negative for dizziness, tremors, weakness and light-headedness.  Hematological: Negative for adenopathy. Does not bruise/bleed easily.  Psychiatric/Behavioral: Negative for behavioral problems, confusion, sleep disturbance, dysphoric mood, decreased concentration and agitation.       Objective:   Physical Exam  Constitutional: He is oriented to person, place, and time. He appears well-developed and well-nourished. No distress.  HENT:  Head: Normocephalic and atraumatic.  Mouth/Throat: Oropharynx is clear and moist. No oropharyngeal exudate.  Eyes: Conjunctivae and EOM are normal. Pupils are equal, round, and reactive to light. No scleral icterus.  Neck: Normal range of motion. Neck supple. No  JVD present.  Cardiovascular: Normal rate, regular rhythm and normal heart sounds.  Exam reveals no gallop and no friction rub.   No murmur heard. Pulmonary/Chest: Effort normal and breath sounds normal. No respiratory distress. He has no wheezes. He has no rales. He exhibits no tenderness.  Abdominal: He exhibits no distension and no mass. There is no tenderness. There is no rebound and no guarding.  Musculoskeletal: He exhibits no edema and no tenderness.  Lymphadenopathy:    He has no cervical adenopathy.  Neurological: He is alert and oriented to person, place, and time. He has normal reflexes. He exhibits normal muscle tone. Coordination normal.  Skin: Skin is warm and dry. He is not diaphoretic. No erythema. No pallor.  Psychiatric: He has a normal mood and affect. His behavior is normal. Judgment and thought content normal.          Assessment & Plan:  HIV: continue Atripla, refer as    HTN: recheck  microalbumin to creatine ratio and increase ACEI component and recheck BMp in one week  HCM: give flu shot

## 2013-02-20 LAB — MICROALBUMIN / CREATININE URINE RATIO: Creatinine, Urine: 212.9 mg/dL

## 2013-02-27 ENCOUNTER — Other Ambulatory Visit: Payer: PRIVATE HEALTH INSURANCE

## 2013-03-10 ENCOUNTER — Other Ambulatory Visit: Payer: PRIVATE HEALTH INSURANCE

## 2013-03-13 ENCOUNTER — Encounter: Payer: Self-pay | Admitting: *Deleted

## 2013-03-13 ENCOUNTER — Other Ambulatory Visit: Payer: PRIVATE HEALTH INSURANCE

## 2013-03-13 ENCOUNTER — Ambulatory Visit: Payer: PRIVATE HEALTH INSURANCE | Admitting: *Deleted

## 2013-03-13 VITALS — BP 133/81 | HR 76

## 2013-03-13 DIAGNOSIS — I10 Essential (primary) hypertension: Secondary | ICD-10-CM

## 2013-03-13 LAB — BASIC METABOLIC PANEL WITH GFR
BUN: 11 mg/dL (ref 6–23)
Chloride: 103 mEq/L (ref 96–112)
GFR, Est African American: >89 mL/min
GFR, Est Non African American: >89 mL/min
Glucose, Bld: 86 mg/dL (ref 70–99)
Potassium: 3.9 mEq/L (ref 3.5–5.3)
Sodium: 139 mEq/L (ref 135–145)

## 2013-03-14 LAB — MICROALBUMIN / CREATININE URINE RATIO
Creatinine, Urine: 68.3 mg/dL
Microalb Creat Ratio: 15.7 mg/g (ref 0.0–30.0)

## 2013-03-18 ENCOUNTER — Encounter: Payer: Self-pay | Admitting: Licensed Clinical Social Worker

## 2013-04-11 ENCOUNTER — Other Ambulatory Visit: Payer: Self-pay | Admitting: Licensed Clinical Social Worker

## 2013-04-11 DIAGNOSIS — B20 Human immunodeficiency virus [HIV] disease: Secondary | ICD-10-CM

## 2013-04-11 MED ORDER — EFAVIRENZ-EMTRICITAB-TENOFOVIR 600-200-300 MG PO TABS: 1.0000 | ORAL_TABLET | Freq: Every day | ORAL | Status: DC

## 2013-11-19 LAB — CLOSTRIDIUM DIFFICILE BY PCR

## 2013-11-26 ENCOUNTER — Ambulatory Visit: Payer: PRIVATE HEALTH INSURANCE | Admitting: Infectious Disease

## 2013-11-27 ENCOUNTER — Ambulatory Visit (INDEPENDENT_AMBULATORY_CARE_PROVIDER_SITE_OTHER): Payer: PRIVATE HEALTH INSURANCE | Admitting: Infectious Disease

## 2013-11-27 ENCOUNTER — Encounter: Payer: Self-pay | Admitting: Infectious Disease

## 2013-11-27 VITALS — BP 125/85 | HR 94 | Temp 98.5°F | Wt 176.0 lb

## 2013-11-27 DIAGNOSIS — B20 Human immunodeficiency virus [HIV] disease: Secondary | ICD-10-CM

## 2013-11-27 DIAGNOSIS — Z79899 Other long term (current) drug therapy: Secondary | ICD-10-CM

## 2013-11-27 DIAGNOSIS — R7989 Other specified abnormal findings of blood chemistry: Secondary | ICD-10-CM

## 2013-11-27 DIAGNOSIS — I319 Disease of pericardium, unspecified: Secondary | ICD-10-CM | POA: Insufficient documentation

## 2013-11-27 DIAGNOSIS — R778 Other specified abnormalities of plasma proteins: Secondary | ICD-10-CM | POA: Insufficient documentation

## 2013-11-27 DIAGNOSIS — Z113 Encounter for screening for infections with a predominantly sexual mode of transmission: Secondary | ICD-10-CM

## 2013-11-27 DIAGNOSIS — A072 Cryptosporidiosis: Secondary | ICD-10-CM | POA: Insufficient documentation

## 2013-11-27 LAB — COMPLETE METABOLIC PANEL WITH GFR
ALBUMIN: 3.6 g/dL (ref 3.5–5.2)
ALT: 173 U/L — ABNORMAL HIGH (ref 0–53)
AST: 59 U/L — ABNORMAL HIGH (ref 0–37)
Alkaline Phosphatase: 156 U/L — ABNORMAL HIGH (ref 39–117)
BUN: 7 mg/dL (ref 6–23)
CALCIUM: 8.8 mg/dL (ref 8.4–10.5)
CHLORIDE: 103 meq/L (ref 96–112)
CO2: 30 meq/L (ref 19–32)
CREATININE: 0.77 mg/dL (ref 0.50–1.35)
GFR, Est Non African American: >89 mL/min
Glucose, Bld: 111 mg/dL — ABNORMAL HIGH (ref 70–99)
Potassium: 4.8 mEq/L (ref 3.5–5.3)
Sodium: 142 mEq/L (ref 135–145)
Total Bilirubin: 0.3 mg/dL (ref 0.2–1.2)
Total Protein: 6.1 g/dL (ref 6.0–8.3)

## 2013-11-27 NOTE — Progress Notes (Signed)
Subjective:    Patient ID: Hayden Hicks, male    DOB: 05/31/1980, 33 y.o.   MRN: [id number]  HPI   Hayden Hicks is a 33 y.o. male who had been doing superbly well on his  antiviral regimen, ATRIPLA  with undetectable viral load and health cd4 count. He had not been seen since September of 2014. He was just admitted last week to [facility] in [place] and apparently diagnosed with cryptosporidoisis and given treatment for that. He and his partner (HIV negative) report that his VL was <20 and CD4 above 500. He also apparently had pericarditis and had slight bump in troponin. He could not have cardiac cath but had exercise stress test which was negative. His diarrhea is improved now only 2 loose bm per day on loperamide.   [facility].    Review of Systems  Constitutional: Negative for fever, chills, diaphoresis, activity change, appetite change, fatigue and unexpected weight change.  HENT: Negative for congestion, sinus pressure, sneezing, sore throat and trouble swallowing.   Eyes: Negative for photophobia and visual disturbance.  Respiratory: Negative for cough, chest tightness, shortness of breath, wheezing and stridor.   Cardiovascular: Positive for chest pain and palpitations. Negative for leg swelling.  Gastrointestinal: Positive for nausea and diarrhea. Negative for vomiting, abdominal pain, constipation, abdominal distention and anal bleeding.  Genitourinary: Negative for dysuria, hematuria, flank pain and difficulty urinating.  Musculoskeletal: Negative for arthralgias, back pain, gait problem, joint swelling and myalgias.  Skin: Negative for color change, pallor, rash and wound.  Neurological: Negative for dizziness, tremors, weakness and light-headedness.  Hematological: Negative for adenopathy. Does not bruise/bleed easily.  Psychiatric/Behavioral: Negative for behavioral problems, confusion, sleep disturbance, dysphoric mood, decreased  concentration and agitation.       Objective:   Physical Exam  Constitutional: He is oriented to person, place, and time. He appears well-developed and well-nourished. No distress.  HENT:  Head: Normocephalic and atraumatic.  Mouth/Throat: Oropharynx is clear and moist. No oropharyngeal exudate.  Eyes: Conjunctivae and EOM are normal. Pupils are equal, round, and reactive to light. No scleral icterus.  Neck: Normal range of motion. Neck supple. No JVD present.  Cardiovascular: Normal rate, regular rhythm and normal heart sounds.  Exam reveals no gallop and no friction rub.   No murmur heard. Pulmonary/Chest: Effort normal and breath sounds normal. No respiratory distress. He has no wheezes. He has no rales. He exhibits no tenderness.  Abdominal: He exhibits no distension and no mass. There is no tenderness. There is no rebound and no guarding.  Musculoskeletal: He exhibits no edema and no tenderness.  Lymphadenopathy:    He has no cervical adenopathy.  Neurological: He is alert and oriented to person, place, and time. He has normal reflexes. He exhibits normal muscle tone. Coordination normal.  Skin: Skin is warm and dry. He is not diaphoretic. No erythema. No pallor.  Psychiatric: He has a normal mood and affect. His behavior is normal. Judgment and thought content normal.          Assessment & Plan:  HIV: continue Atripla, recheck VL today and get records from [facility]. I spent greater than 25 minutes with the patient including greater than 50% of time in face to face counsel of the patient and in coordination of their care.   Cryptosporidiosis: get records from [facility] on his treatment  Hypokalemia and renal insufficiency: recheck labs  Pericarditis and chest pain + troponin leak:records  from [place]. [place] and he and his partner would prefer to only have him come 1-2 times per year. Can do labs same day to cut down on visits.

## 2013-11-28 LAB — RPR

## 2013-12-01 LAB — HIV-1 RNA QUANT-NO REFLEX-BLD: HIV-1 RNA Quant, Log: <1.3 {Log} (ref <1.30)

## 2014-04-21 ENCOUNTER — Other Ambulatory Visit: Payer: Self-pay | Admitting: *Deleted

## 2014-04-21 DIAGNOSIS — B20 Human immunodeficiency virus [HIV] disease: Secondary | ICD-10-CM

## 2014-04-21 MED ORDER — EFAVIRENZ-EMTRICITAB-TENOFOVIR 600-200-300 MG PO TABS: 1.0000 | ORAL_TABLET | Freq: Every day | ORAL | Status: DC

## 2014-06-10 ENCOUNTER — Ambulatory Visit (INDEPENDENT_AMBULATORY_CARE_PROVIDER_SITE_OTHER): Payer: PRIVATE HEALTH INSURANCE | Admitting: Infectious Disease

## 2014-06-10 ENCOUNTER — Encounter: Payer: Self-pay | Admitting: Infectious Disease

## 2014-06-10 VITALS — BP 153/95 | HR 84 | Temp 97.6°F | Wt 184.0 lb

## 2014-06-10 DIAGNOSIS — Z79899 Other long term (current) drug therapy: Secondary | ICD-10-CM

## 2014-06-10 DIAGNOSIS — B20 Human immunodeficiency virus [HIV] disease: Secondary | ICD-10-CM

## 2014-06-10 DIAGNOSIS — B1710 Acute hepatitis C without hepatic coma: Secondary | ICD-10-CM

## 2014-06-10 DIAGNOSIS — Z23 Encounter for immunization: Secondary | ICD-10-CM

## 2014-06-10 DIAGNOSIS — I10 Essential (primary) hypertension: Secondary | ICD-10-CM

## 2014-06-10 DIAGNOSIS — Z113 Encounter for screening for infections with a predominantly sexual mode of transmission: Secondary | ICD-10-CM

## 2014-06-10 LAB — COMPLETE METABOLIC PANEL WITH GFR
ALBUMIN: 4.4 g/dL (ref 3.5–5.2)
ALK PHOS: 74 U/L (ref 39–117)
ALT: 38 U/L (ref 0–53)
AST: 26 U/L (ref 0–37)
BILIRUBIN TOTAL: 0.5 mg/dL (ref 0.2–1.2)
BUN: 15 mg/dL (ref 6–23)
CO2: 29 mEq/L (ref 19–32)
Calcium: 9.6 mg/dL (ref 8.4–10.5)
Chloride: 103 mEq/L (ref 96–112)
Creat: 0.87 mg/dL (ref 0.50–1.35)
GFR, Est African American: >89 mL/min
GFR, Est Non African American: >89 mL/min
Glucose, Bld: 82 mg/dL (ref 70–99)
POTASSIUM: 4.1 meq/L (ref 3.5–5.3)
Sodium: 141 mEq/L (ref 135–145)
TOTAL PROTEIN: 6.9 g/dL (ref 6.0–8.3)

## 2014-06-10 LAB — CBC WITH DIFFERENTIAL/PLATELET
Basophils Absolute: 0.1 10*3/uL (ref 0.0–0.1)
Basophils Relative: 1 % (ref 0–1)
Eosinophils Absolute: 0.1 10*3/uL (ref 0.0–0.7)
Eosinophils Relative: 1 % (ref 0–5)
HCT: 44.7 % (ref 39.0–52.0)
Hemoglobin: 15.7 g/dL (ref 13.0–17.0)
LYMPHS PCT: 42 % (ref 12–46)
Lymphs Abs: 2.7 10*3/uL (ref 0.7–4.0)
MCH: 32.4 pg (ref 26.0–34.0)
MCHC: 35.1 g/dL (ref 30.0–36.0)
MCV: 92.2 fL (ref 78.0–100.0)
MONO ABS: 0.6 10*3/uL (ref 0.1–1.0)
MONOS PCT: 10 % (ref 3–12)
MPV: 8.6 fL (ref 8.6–12.4)
NEUTROS ABS: 2.9 10*3/uL (ref 1.7–7.7)
Neutrophils Relative %: 46 % (ref 43–77)
Platelets: 184 10*3/uL (ref 150–400)
RBC: 4.85 MIL/uL (ref 4.22–5.81)
RDW: 13.5 % (ref 11.5–15.5)
WBC: 6.4 10*3/uL (ref 4.0–10.5)

## 2014-06-10 LAB — LIPID PANEL
Cholesterol: 186 mg/dL (ref 0–200)
HDL: 58 mg/dL (ref >39)
LDL Cholesterol: 104 mg/dL — ABNORMAL HIGH (ref 0–99)
Total CHOL/HDL Ratio: 3.2 Ratio
Triglycerides: 121 mg/dL (ref <150)
VLDL: 24 mg/dL (ref 0–40)

## 2014-06-10 LAB — PHOSPHORUS: PHOSPHORUS: 3.6 mg/dL (ref 2.3–4.6)

## 2014-06-10 MED ORDER — EFAVIRENZ-EMTRICITAB-TENOFOVIR 600-200-300 MG PO TABS: 1.0000 | ORAL_TABLET | Freq: Every day | ORAL | Status: DC

## 2014-06-10 MED ORDER — AMLODIPINE BESY-BENAZEPRIL HCL 5-40 MG PO CAPS: 1.0000 | ORAL_CAPSULE | Freq: Every day | ORAL | Status: DC

## 2014-06-10 NOTE — Progress Notes (Signed)
  Subjective:    Patient ID: Hayden Hicks, male    DOB: 1980-10-27, 34 y.o.   MRN: [id number]  HPI   Hayden Hicks is a 34 y.o. male who had been doing superbly well on his  antiviral regimen, ATRIPLA  with undetectable viral load and health cd4 count. He had not been seen since September of 2014. He was just admitted this fall [facility] in [place] and apparently diagnosed with cryptosporidoisis and given treatment for that.   He and his partner (HIV negative) report that his VL was <20 and CD4 above 500.   Lab Results  Component Value Date   HIV1RNAQUANT <20 11/27/2013   Lab Results  Component Value Date   CD4TABS 770 02/05/2013   CD4TABS 670 09/04/2012   CD4TABS 990 02/21/2012       Review of Systems  Constitutional: Negative for fever, chills, diaphoresis, activity change, appetite change, fatigue and unexpected weight change.  HENT: Negative for congestion, sinus pressure, sneezing, sore throat and trouble swallowing.   Eyes: Negative for photophobia and visual disturbance.  Respiratory: Negative for cough, chest tightness, shortness of breath, wheezing and stridor.   Cardiovascular: Negative for chest pain, palpitations and leg swelling.  Gastrointestinal: Negative for nausea, vomiting, abdominal pain, constipation, abdominal distention and anal bleeding.  Genitourinary: Negative for dysuria, hematuria, flank pain and difficulty urinating.  Musculoskeletal: Negative for myalgias, back pain, joint swelling, arthralgias and gait problem.  Skin: Negative for color change, pallor, rash and wound.  Neurological: Negative for dizziness, tremors, weakness and light-headedness.  Hematological: Negative for adenopathy. Does not bruise/bleed easily.  Psychiatric/Behavioral: Negative for behavioral problems, confusion, sleep disturbance, dysphoric mood, decreased concentration and agitation.       Objective:   Physical Exam  Constitutional: He is oriented to person,  place, and time. He appears well-developed and well-nourished. No distress.  HENT:  Head: Normocephalic and atraumatic.  Mouth/Throat: Oropharynx is clear and moist. No oropharyngeal exudate.  Eyes: Conjunctivae and EOM are normal. Pupils are equal, round, and reactive to light. No scleral icterus.  Neck: Normal range of motion. Neck supple. No JVD present.  Cardiovascular: Normal rate and regular rhythm.   Pulmonary/Chest: Effort normal and breath sounds normal. No respiratory distress. He has no wheezes.  Abdominal: He exhibits no distension and no mass. There is no tenderness. There is no rebound and no guarding.  Musculoskeletal: He exhibits no edema or tenderness.  Lymphadenopathy:    He has no cervical adenopathy.  Neurological: He is alert and oriented to person, place, and time. He exhibits normal muscle tone. Coordination normal.  Skin: Skin is warm and dry. He is not diaphoretic. No erythema. No pallor.  Psychiatric: He has a normal mood and affect. His behavior is normal. Judgment and thought content normal.          Assessment & Plan:  HIV: continue Atripla, recheck VL and CD4 today   HTN: restart meds

## 2014-06-11 LAB — MICROALBUMIN / CREATININE URINE RATIO
Creatinine, Urine: 218.2 mg/dL
Microalb Creat Ratio: 26.6 mg/g (ref 0.0–30.0)
Microalb, Ur: 5.8 mg/dL — ABNORMAL HIGH (ref <2.0)

## 2014-06-11 LAB — T-HELPER CELL (CD4) - (RCID CLINIC ONLY)
CD4 T CELL ABS: 1030 /uL (ref 400–2700)
CD4 T CELL HELPER: 40 % (ref 33–55)

## 2014-06-11 LAB — RPR

## 2014-06-11 LAB — URINE CYTOLOGY ANCILLARY ONLY
CHLAMYDIA, DNA PROBE: NEGATIVE
Neisseria Gonorrhea: NEGATIVE

## 2014-06-12 LAB — HIV-1 RNA ULTRAQUANT REFLEX TO GENTYP+: HIV 1 RNA Quant: <20 copies/mL (ref <20)

## 2014-06-22 LAB — HEPATITIS C RNA QUANTITATIVE: HCV Quantitative: NOT DETECTED IU/mL (ref <15)

## 2014-06-23 LAB — HEPATITIS C GENOTYPE

## 2014-10-14 ENCOUNTER — Ambulatory Visit: Payer: PRIVATE HEALTH INSURANCE | Admitting: Infectious Disease

## 2014-12-14 ENCOUNTER — Ambulatory Visit: Payer: PRIVATE HEALTH INSURANCE | Admitting: Infectious Disease

## 2015-01-20 ENCOUNTER — Ambulatory Visit (INDEPENDENT_AMBULATORY_CARE_PROVIDER_SITE_OTHER): Payer: PRIVATE HEALTH INSURANCE | Admitting: Infectious Disease

## 2015-01-20 ENCOUNTER — Encounter: Payer: Self-pay | Admitting: Infectious Disease

## 2015-01-20 VITALS — BP 138/88 | HR 64 | Temp 98.3°F | Wt 182.0 lb

## 2015-01-20 DIAGNOSIS — B20 Human immunodeficiency virus [HIV] disease: Secondary | ICD-10-CM

## 2015-01-20 DIAGNOSIS — Z23 Encounter for immunization: Secondary | ICD-10-CM

## 2015-01-20 DIAGNOSIS — I10 Essential (primary) hypertension: Secondary | ICD-10-CM | POA: Diagnosis not present

## 2015-01-20 LAB — COMPLETE METABOLIC PANEL WITH GFR
ALT: 26 U/L (ref 9–46)
AST: 21 U/L (ref 10–40)
Albumin: 4.8 g/dL (ref 3.6–5.1)
Alkaline Phosphatase: 73 U/L (ref 40–115)
BILIRUBIN TOTAL: 0.4 mg/dL (ref 0.2–1.2)
BUN: 18 mg/dL (ref 7–25)
CO2: 27 mmol/L (ref 20–31)
CREATININE: 0.94 mg/dL (ref 0.60–1.35)
Calcium: 9.7 mg/dL (ref 8.6–10.3)
Chloride: 100 mmol/L (ref 98–110)
GFR, Est Non African American: >89 mL/min (ref >=60)
GLUCOSE: 84 mg/dL (ref 65–99)
Potassium: 3.9 mmol/L (ref 3.5–5.3)
Sodium: 139 mmol/L (ref 135–146)
TOTAL PROTEIN: 7.2 g/dL (ref 6.1–8.1)

## 2015-01-20 LAB — CBC WITH DIFFERENTIAL/PLATELET
Basophils Absolute: 0 10*3/uL (ref 0.0–0.1)
Basophils Relative: 0 % (ref 0–1)
EOS PCT: 1 % (ref 0–5)
Eosinophils Absolute: 0.1 10*3/uL (ref 0.0–0.7)
HEMATOCRIT: 45.6 % (ref 39.0–52.0)
Hemoglobin: 16.3 g/dL (ref 13.0–17.0)
LYMPHS ABS: 3.1 10*3/uL (ref 0.7–4.0)
LYMPHS PCT: 44 % (ref 12–46)
MCH: 32.5 pg (ref 26.0–34.0)
MCHC: 35.7 g/dL (ref 30.0–36.0)
MCV: 91 fL (ref 78.0–100.0)
MONOS PCT: 7 % (ref 3–12)
MPV: 8.6 fL (ref 8.6–12.4)
Monocytes Absolute: 0.5 10*3/uL (ref 0.1–1.0)
Neutro Abs: 3.4 10*3/uL (ref 1.7–7.7)
Neutrophils Relative %: 48 % (ref 43–77)
PLATELETS: 195 10*3/uL (ref 150–400)
RBC: 5.01 MIL/uL (ref 4.22–5.81)
RDW: 13.3 % (ref 11.5–15.5)
WBC: 7.1 10*3/uL (ref 4.0–10.5)

## 2015-01-20 MED ORDER — ABACAVIR-DOLUTEGRAVIR-LAMIVUD 600-50-300 MG PO TABS: 1.0000 | ORAL_TABLET | Freq: Every day | ORAL | Status: DC

## 2015-01-20 NOTE — Progress Notes (Signed)
  Subjective:    Patient ID: Hayden Hicks, male    DOB: June 25, 1980, 34 y.o.   MRN: [id number]  HPI   Hayden Hicks is a 34 y.o. male who had been doing superbly well on his  antiviral regimen, ATRIPLA  with undetectable viral load and health cd4 count.. He has not however had labs since January.  Lab Results  Component Value Date   HIV1RNAQUANT <20 06/10/2014   Lab Results  Component Value Date   CD4TABS 1030 06/10/2014   CD4TABS 770 02/05/2013   CD4TABS 670 09/04/2012   We discussed switch to more kidney friendly, bone friendly STR and he opted for TRIUMEQ.     Review of Systems  Constitutional: Negative for fever, chills, diaphoresis, activity change, appetite change, fatigue and unexpected weight change.  HENT: Negative for congestion, sinus pressure, sneezing, sore throat and trouble swallowing.   Eyes: Negative for photophobia and visual disturbance.  Respiratory: Negative for cough, chest tightness, shortness of breath, wheezing and stridor.   Cardiovascular: Negative for chest pain, palpitations and leg swelling.  Gastrointestinal: Negative for nausea, vomiting, abdominal pain, constipation, abdominal distention and anal bleeding.  Genitourinary: Negative for dysuria, hematuria, flank pain and difficulty urinating.  Musculoskeletal: Negative for myalgias, back pain, joint swelling, arthralgias and gait problem.  Skin: Negative for color change, pallor, rash and wound.  Neurological: Negative for weakness.  Hematological: Negative for adenopathy. Does not bruise/bleed easily.  Psychiatric/Behavioral: Negative for behavioral problems, confusion, sleep disturbance, dysphoric mood, decreased concentration and agitation.       Objective:   Physical Exam  Constitutional: He is oriented to person, place, and time. He appears well-developed and well-nourished. No distress.  HENT:  Head: Normocephalic and atraumatic.  Mouth/Throat: Oropharynx is clear and moist. No  oropharyngeal exudate.  Eyes: Conjunctivae and EOM are normal. Pupils are equal, round, and reactive to light. No scleral icterus.  Neck: Normal range of motion. Neck supple. No JVD present.  Cardiovascular: Normal rate and regular rhythm.   Pulmonary/Chest: Effort normal and breath sounds normal. No respiratory distress. He has no wheezes.  Abdominal: He exhibits no distension and no mass. There is no tenderness. There is no rebound and no guarding.  Musculoskeletal: He exhibits no edema or tenderness.  Lymphadenopathy:    He has no cervical adenopathy.  Neurological: He is alert and oriented to person, place, and time. He exhibits normal muscle tone. Coordination normal.  Skin: Skin is warm and dry. He is not diaphoretic. No erythema. No pallor.  Psychiatric: He has a normal mood and affect. His behavior is normal. Judgment and thought content normal.          Assessment & Plan:  HIV: change to TRIUMEQ, check labs today and in one month  HTN: BP log reviewed and BP ranges are in acceptable range  I spent greater than 40 minutes with the patient including greater than 50% of time in face to face counsel of the patient re his HIV, his new ARV regimen and re his HTN and in coordination of their care.

## 2015-01-21 LAB — HIV-1 RNA QUANT-NO REFLEX-BLD: HIV-1 RNA Quant, Log: <1.3 {Log} (ref <1.30)

## 2015-01-21 LAB — RPR

## 2015-01-22 LAB — T-HELPER CELL (CD4) - (RCID CLINIC ONLY)
CD4 T CELL ABS: 1080 /uL (ref 400–2700)
CD4 T CELL HELPER: 36 % (ref 33–55)

## 2015-02-18 ENCOUNTER — Other Ambulatory Visit: Payer: PRIVATE HEALTH INSURANCE

## 2015-02-18 DIAGNOSIS — Z113 Encounter for screening for infections with a predominantly sexual mode of transmission: Secondary | ICD-10-CM

## 2015-02-18 DIAGNOSIS — I10 Essential (primary) hypertension: Secondary | ICD-10-CM

## 2015-02-18 DIAGNOSIS — Z79899 Other long term (current) drug therapy: Secondary | ICD-10-CM

## 2015-02-18 DIAGNOSIS — B20 Human immunodeficiency virus [HIV] disease: Secondary | ICD-10-CM

## 2015-02-18 DIAGNOSIS — Z23 Encounter for immunization: Secondary | ICD-10-CM

## 2015-02-18 LAB — CBC WITH DIFFERENTIAL/PLATELET
BASOS ABS: 0 10*3/uL (ref 0.0–0.1)
BASOS PCT: 0 % (ref 0–1)
EOS ABS: 0.1 10*3/uL (ref 0.0–0.7)
Eosinophils Relative: 1 % (ref 0–5)
HCT: 44.8 % (ref 39.0–52.0)
HEMOGLOBIN: 15.6 g/dL (ref 13.0–17.0)
Lymphocytes Relative: 23 % (ref 12–46)
Lymphs Abs: 1.5 10*3/uL (ref 0.7–4.0)
MCH: 32.6 pg (ref 26.0–34.0)
MCHC: 34.8 g/dL (ref 30.0–36.0)
MCV: 93.5 fL (ref 78.0–100.0)
MONOS PCT: 7 % (ref 3–12)
MPV: 9 fL (ref 8.6–12.4)
Monocytes Absolute: 0.5 10*3/uL (ref 0.1–1.0)
NEUTROS ABS: 4.6 10*3/uL (ref 1.7–7.7)
NEUTROS PCT: 69 % (ref 43–77)
PLATELETS: 195 10*3/uL (ref 150–400)
RBC: 4.79 MIL/uL (ref 4.22–5.81)
RDW: 13.3 % (ref 11.5–15.5)
WBC: 6.7 10*3/uL (ref 4.0–10.5)

## 2015-02-18 LAB — COMPLETE METABOLIC PANEL WITH GFR
ALT: 23 U/L (ref 9–46)
AST: 18 U/L (ref 10–40)
Albumin: 4.5 g/dL (ref 3.6–5.1)
Alkaline Phosphatase: 53 U/L (ref 40–115)
BILIRUBIN TOTAL: 0.4 mg/dL (ref 0.2–1.2)
BUN: 16 mg/dL (ref 7–25)
CO2: 29 mmol/L (ref 20–31)
Calcium: 9.4 mg/dL (ref 8.6–10.3)
Chloride: 99 mmol/L (ref 98–110)
Creat: 1.14 mg/dL (ref 0.60–1.35)
GFR, EST NON AFRICAN AMERICAN: 83 mL/min (ref >=60)
GFR, Est African American: >89 mL/min (ref >=60)
GLUCOSE: 156 mg/dL — AB (ref 65–99)
Potassium: 4 mmol/L (ref 3.5–5.3)
SODIUM: 142 mmol/L (ref 135–146)
TOTAL PROTEIN: 6.7 g/dL (ref 6.1–8.1)

## 2015-02-19 LAB — MICROALBUMIN / CREATININE URINE RATIO
Creatinine, Urine: 93.5 mg/dL
Microalb Creat Ratio: 8.6 mg/g (ref 0.0–30.0)
Microalb, Ur: 0.8 mg/dL

## 2015-02-19 LAB — T-HELPER CELL (CD4) - (RCID CLINIC ONLY)
CD4 T CELL ABS: 490 /uL (ref 400–2700)
CD4 T CELL HELPER: 30 % — AB (ref 33–55)

## 2015-02-19 LAB — URINE CYTOLOGY ANCILLARY ONLY
Chlamydia: NEGATIVE
Neisseria Gonorrhea: NEGATIVE

## 2015-02-21 LAB — HIV-1 RNA QUANT-NO REFLEX-BLD: HIV 1 RNA Quant: <20 copies/mL (ref <20)

## 2015-04-07 ENCOUNTER — Encounter: Payer: Self-pay | Admitting: Infectious Disease

## 2015-04-07 ENCOUNTER — Ambulatory Visit (INDEPENDENT_AMBULATORY_CARE_PROVIDER_SITE_OTHER): Payer: PRIVATE HEALTH INSURANCE | Admitting: Infectious Disease

## 2015-04-07 VITALS — BP 125/75 | HR 64 | Temp 98.0°F | Wt 187.0 lb

## 2015-04-07 DIAGNOSIS — I10 Essential (primary) hypertension: Secondary | ICD-10-CM | POA: Diagnosis not present

## 2015-04-07 DIAGNOSIS — B20 Human immunodeficiency virus [HIV] disease: Secondary | ICD-10-CM

## 2015-04-07 NOTE — Progress Notes (Signed)
Subjective:    Patient ID: Hayden Hicks, male    DOB: [birth year], 34 y.o.   MRN: [id number]  HPI   Hayden Hicks is a 34 y.o. male who had been doing superbly well on his  antiviral regimen, [facility]  with undetectable viral load and health cd4 count..   Lab Results  Component Value Date   HIV1RNAQUANT <20 02/18/2015   Lab Results  Component Value Date   CD4TABS 490 02/18/2015   CD4TABS 1080 01/20/2015   CD4TABS 1030 06/10/2014   Past Medical History  Diagnosis Date  . HIV infection (HCC)   . Hypertension   . Arthritis   . Cryptosporidial gastroenteritis (HCC)   . Pericarditis   . Elevated troponin     Past Surgical History  Procedure Laterality Date  . Knee arthroscopy      No family history on file.    Social History   Social History  . Marital Status: Single    Spouse Name: N/A  . Number of Children: N/A  . Years of Education: N/A   Social History Main Topics  . Smoking status: Never Smoker   . Smokeless tobacco: Never Used  . Alcohol Use: No  . Drug Use: No  . Sexual Activity:    Partners: Male   Other Topics Concern  . None   Social History Narrative    No Known Allergies   Current outpatient prescriptions:  .  Abacavir-Dolutegravir-Lamivud (TRIUMEQ) 600-50-300 MG TABS, Take 1 tablet by mouth daily., Disp: 30 tablet, Rfl: 11 .  amLODipine-benazepril (LOTREL) 5-40 MG per capsule, Take 1 capsule by mouth daily., Disp: 30 capsule, Rfl: 11 .  clonazePAM (KLONOPIN) 1 MG tablet, Take 1 tablet (1 mg total) by mouth daily as needed., Disp: 30 tablet, Rfl: 0     Review of Systems  Constitutional: Negative for fever, chills, diaphoresis, activity change, appetite change, fatigue and unexpected weight change.  HENT: Negative for congestion, sinus pressure, sneezing, sore throat and trouble swallowing.   Eyes: Negative for photophobia and visual disturbance.  Respiratory: Negative for cough, chest tightness, shortness of  breath, wheezing and stridor.   Cardiovascular: Negative for chest pain, palpitations and leg swelling.  Gastrointestinal: Negative for nausea, vomiting, abdominal pain, constipation, abdominal distention and anal bleeding.  Genitourinary: Negative for dysuria, hematuria, flank pain and difficulty urinating.  Musculoskeletal: Negative for myalgias, back pain, joint swelling, arthralgias and gait problem.  Skin: Negative for color change, pallor, rash and wound.  Neurological: Negative for weakness.  Hematological: Negative for adenopathy. Does not bruise/bleed easily.  Psychiatric/Behavioral: Negative for behavioral problems, confusion, sleep disturbance, dysphoric mood, decreased concentration and agitation.       Objective:   Physical Exam  Constitutional: He is oriented to person, place, and time. He appears well-developed and well-nourished. No distress.  HENT:  Head: Normocephalic and atraumatic.  Mouth/Throat: Oropharynx is clear and moist. No oropharyngeal exudate.  Eyes: Conjunctivae and EOM are normal. Pupils are equal, round, and reactive to light. No scleral icterus.  Neck: Normal range of motion. Neck supple. No JVD present.  Cardiovascular: Normal rate and regular rhythm.   Pulmonary/Chest: Effort normal and breath sounds normal. No respiratory distress. He has no wheezes.  Abdominal: He exhibits no distension and no mass. There is no tenderness. There is no rebound and no guarding.  Musculoskeletal: He exhibits no edema or tenderness.  Lymphadenopathy:    He has no cervical adenopathy.  Neurological: He is alert and oriented  to person, place, and time. He exhibits normal muscle tone. Coordination normal.  Skin: Skin is warm and dry. He is not diaphoretic. No erythema. No pallor.  Psychiatric: He has a normal mood and affect. His behavior is normal. Judgment and thought content normal.          Assessment & Plan:  HIV: change to TRIUMEQ RTC in 1 year  HTN: at  goal  I spent greater than 25 minutes with the patient including greater than 50% of time in face to face counsel of the patient re his HIV, his new ARV regimen and re his HTN and in coordination of their care.

## 2015-06-18 ENCOUNTER — Other Ambulatory Visit: Payer: Self-pay | Admitting: *Deleted

## 2015-06-18 DIAGNOSIS — Z23 Encounter for immunization: Secondary | ICD-10-CM

## 2015-06-18 DIAGNOSIS — I10 Essential (primary) hypertension: Secondary | ICD-10-CM

## 2015-06-18 DIAGNOSIS — B20 Human immunodeficiency virus [HIV] disease: Secondary | ICD-10-CM

## 2015-06-18 DIAGNOSIS — Z113 Encounter for screening for infections with a predominantly sexual mode of transmission: Secondary | ICD-10-CM

## 2015-06-18 DIAGNOSIS — Z79899 Other long term (current) drug therapy: Secondary | ICD-10-CM

## 2015-06-18 MED ORDER — AMLODIPINE BESY-BENAZEPRIL HCL 5-40 MG PO CAPS: 1.0000 | ORAL_CAPSULE | Freq: Every day | ORAL | Status: DC

## 2015-12-31 ENCOUNTER — Other Ambulatory Visit: Payer: Self-pay | Admitting: Infectious Disease

## 2015-12-31 DIAGNOSIS — B20 Human immunodeficiency virus [HIV] disease: Secondary | ICD-10-CM

## 2016-05-11 ENCOUNTER — Encounter: Payer: Self-pay | Admitting: Infectious Disease

## 2016-05-11 ENCOUNTER — Other Ambulatory Visit (HOSPITAL_COMMUNITY)
Admission: RE | Admit: 2016-05-11 | Discharge: 2016-05-11 | Disposition: A | Discharge status: Home / Self Care | Payer: BLUE CROSS/BLUE SHIELD | Source: Ambulatory Visit | Attending: Infectious Disease | Admitting: Infectious Disease

## 2016-05-11 ENCOUNTER — Ambulatory Visit (INDEPENDENT_AMBULATORY_CARE_PROVIDER_SITE_OTHER): Payer: BLUE CROSS/BLUE SHIELD | Admitting: Infectious Disease

## 2016-05-11 VITALS — BP 150/88 | HR 66 | Temp 98.2°F | Wt 191.8 lb

## 2016-05-11 DIAGNOSIS — Z113 Encounter for screening for infections with a predominantly sexual mode of transmission: Secondary | ICD-10-CM | POA: Diagnosis not present

## 2016-05-11 DIAGNOSIS — B20 Human immunodeficiency virus [HIV] disease: Secondary | ICD-10-CM

## 2016-05-11 DIAGNOSIS — I10 Essential (primary) hypertension: Secondary | ICD-10-CM

## 2016-05-11 DIAGNOSIS — Z79899 Other long term (current) drug therapy: Secondary | ICD-10-CM

## 2016-05-11 DIAGNOSIS — Z23 Encounter for immunization: Secondary | ICD-10-CM

## 2016-05-11 LAB — COMPLETE METABOLIC PANEL WITH GFR
ALT: 34 U/L (ref 9–46)
AST: 19 U/L (ref 10–40)
Albumin: 4.4 g/dL (ref 3.6–5.1)
Alkaline Phosphatase: 56 U/L (ref 40–115)
BUN: 13 mg/dL (ref 7–25)
CHLORIDE: 105 mmol/L (ref 98–110)
CO2: 27 mmol/L (ref 20–31)
CREATININE: 1.13 mg/dL (ref 0.60–1.35)
Calcium: 9.2 mg/dL (ref 8.6–10.3)
GFR, Est African American: >89 mL/min (ref >=60)
GFR, Est Non African American: 84 mL/min (ref >=60)
GLUCOSE: 90 mg/dL (ref 65–99)
POTASSIUM: 4.1 mmol/L (ref 3.5–5.3)
SODIUM: 140 mmol/L (ref 135–146)
Total Bilirubin: 0.5 mg/dL (ref 0.2–1.2)
Total Protein: 6.8 g/dL (ref 6.1–8.1)

## 2016-05-11 LAB — CBC WITH DIFFERENTIAL/PLATELET
Basophils Absolute: 0 cells/uL (ref 0–200)
Basophils Relative: 0 %
EOS ABS: 81 {cells}/uL (ref 15–500)
Eosinophils Relative: 1 %
HEMATOCRIT: 47.8 % (ref 38.5–50.0)
HEMOGLOBIN: 16.6 g/dL (ref 13.2–17.1)
LYMPHS ABS: 2592 {cells}/uL (ref 850–3900)
LYMPHS PCT: 32 %
MCH: 32.4 pg (ref 27.0–33.0)
MCHC: 34.7 g/dL (ref 32.0–36.0)
MCV: 93.2 fL (ref 80.0–100.0)
MONO ABS: 810 {cells}/uL (ref 200–950)
MPV: 9.2 fL (ref 7.5–12.5)
Monocytes Relative: 10 %
NEUTROS PCT: 57 %
Neutro Abs: 4617 cells/uL (ref 1500–7800)
Platelets: 211 10*3/uL (ref 140–400)
RBC: 5.13 MIL/uL (ref 4.20–5.80)
RDW: 13.1 % (ref 11.0–15.0)
WBC: 8.1 10*3/uL (ref 3.8–10.8)

## 2016-05-11 MED ORDER — ABACAVIR-DOLUTEGRAVIR-LAMIVUD 600-50-300 MG PO TABS: 1.0000 | ORAL_TABLET | Freq: Every day | ORAL | 11 refills | Status: DC

## 2016-05-11 MED ORDER — AMLODIPINE BESY-BENAZEPRIL HCL 5-40 MG PO CAPS: 1.0000 | ORAL_CAPSULE | Freq: Every day | ORAL | 11 refills | Status: DC

## 2016-05-11 MED ORDER — MENINGOCOCCAL A C Y&W-135 OLIG IM SOLR: 0.5000 mL | Freq: Once | INTRAMUSCULAR | Status: DC

## 2016-05-11 NOTE — Progress Notes (Signed)
Subjective:    Patient ID: Hayden Hicks, male    DOB: [birth year], 35 y.o.   MRN: [id number]  HPI  Hayden Hicks is a 35 y.o. male who had been doing superbly well on his  antiviral regimen, [facility]  with undetectable viral load and health cd4 count.Marland Kitchen.    He returns for his yearly visit and to get labs and be vaccinated for influenza was also vaccinated for meningococcus. He states he is not sexually active for many years and also has been abstained from alcohol during this period he acquired HIV through sexual his HIV positive male partner in the past.   Lab Results  Component Value Date   HIV1RNAQUANT <20 02/18/2015   Lab Results  Component Value Date   CD4TABS 490 02/18/2015   CD4TABS 1,080 01/20/2015   CD4TABS 1,030 06/10/2014   Past Medical History:  Diagnosis Date  . Arthritis   . Cryptosporidial gastroenteritis (HCC)   . Elevated troponin   . HIV infection (HCC)   . Hypertension   . Pericarditis     Past Surgical History:  Procedure Laterality Date  . KNEE ARTHROSCOPY      No family history on file.    Social History   Social History  . Marital status: Single    Spouse name: N/A  . Number of children: N/A  . Years of education: N/A   Social History Main Topics  . Smoking status: Never Smoker  . Smokeless tobacco: Never Used  . Alcohol use No  . Drug use: No  . Sexual activity: Yes    Partners: Male   Other Topics Concern  . Not on file   Social History Narrative  . No narrative on file    No Known Allergies   Current Outpatient Prescriptions:  .  amLODipine-benazepril (LOTREL) 5-40 MG capsule, Take 1 capsule by mouth daily., Disp: 30 capsule, Rfl: 11 .  clonazePAM (KLONOPIN) 1 MG tablet, Take 1 tablet (1 mg total) by mouth daily as needed., Disp: 30 tablet, Rfl: 0 .  TRIUMEQ 600-50-300 MG tablet, TAKE 1 TABLET BY MOUTH DAILY., Disp: 30 tablet, Rfl: 4     Review of Systems  Constitutional: Negative for  activity change, appetite change, chills, diaphoresis, fatigue, fever and unexpected weight change.  HENT: Negative for congestion, sinus pressure, sneezing, sore throat and trouble swallowing.   Eyes: Negative for photophobia and visual disturbance.  Respiratory: Negative for cough, chest tightness, shortness of breath, wheezing and stridor.   Cardiovascular: Negative for chest pain, palpitations and leg swelling.  Gastrointestinal: Negative for abdominal distention, abdominal pain, anal bleeding, constipation, nausea and vomiting.  Genitourinary: Negative for difficulty urinating, dysuria, flank pain and hematuria.  Musculoskeletal: Negative for arthralgias, back pain, gait problem, joint swelling and myalgias.  Skin: Negative for color change, pallor, rash and wound.  Neurological: Negative for weakness.  Hematological: Negative for adenopathy. Does not bruise/bleed easily.  Psychiatric/Behavioral: Negative for agitation, behavioral problems, confusion, decreased concentration, dysphoric mood and sleep disturbance.       Objective:   Physical Exam  Constitutional: He is oriented to person, place, and time. He appears well-developed and well-nourished. No distress.  HENT:  Head: Normocephalic and atraumatic.  Mouth/Throat: Oropharynx is clear and moist. No oropharyngeal exudate.  Eyes: Conjunctivae and EOM are normal. Pupils are equal, round, and reactive to light. No scleral icterus.  Neck: Normal range of motion. Neck supple. No JVD present.  Cardiovascular: Normal rate and regular rhythm.  Pulmonary/Chest: Effort normal and breath sounds normal. No respiratory distress. He has no wheezes.  Abdominal: He exhibits no distension and no mass. There is no tenderness. There is no rebound and no guarding.  Musculoskeletal: He exhibits no edema or tenderness.  Lymphadenopathy:    He has no cervical adenopathy.  Neurological: He is alert and oriented to person, place, and time. He exhibits  normal muscle tone. Coordination normal.  Skin: Skin is warm and dry. He is not diaphoretic. No erythema. No pallor.  Psychiatric: He has a normal mood and affect. His behavior is normal. Judgment and thought content normal.          Assessment & Plan:  HIV: Continue TRIUMEQ RTC in 1 year after eating labs today  HTN: Blood pressure has been at goal

## 2016-05-12 LAB — URINE CYTOLOGY ANCILLARY ONLY
Chlamydia: NEGATIVE
NEISSERIA GONORRHEA: NEGATIVE

## 2016-05-12 LAB — T-HELPER CELL (CD4) - (RCID CLINIC ONLY)
CD4 T CELL ABS: 810 /uL (ref 400–2700)
CD4 T CELL HELPER: 28 % — AB (ref 33–55)

## 2016-05-12 LAB — RPR

## 2016-05-15 LAB — HIV RNA, RTPCR W/R GT (RTI, PI,INT)
HIV-1 RNA, QN PCR: <1.3 Log copies/mL
HIV-1 RNA, QN PCR: <20 copies/mL

## 2017-02-20 ENCOUNTER — Encounter: Payer: Self-pay | Admitting: Infectious Disease

## 2017-03-16 ENCOUNTER — Other Ambulatory Visit: Payer: Self-pay | Admitting: *Deleted

## 2017-03-16 DIAGNOSIS — B20 Human immunodeficiency virus [HIV] disease: Secondary | ICD-10-CM

## 2017-03-16 MED ORDER — ABACAVIR-DOLUTEGRAVIR-LAMIVUD 600-50-300 MG PO TABS: 1.0000 | ORAL_TABLET | Freq: Every day | ORAL | 6 refills | Status: DC

## 2017-03-21 ENCOUNTER — Ambulatory Visit (INDEPENDENT_AMBULATORY_CARE_PROVIDER_SITE_OTHER): Payer: Self-pay | Admitting: Infectious Disease

## 2017-03-21 ENCOUNTER — Encounter: Payer: Self-pay | Admitting: Infectious Disease

## 2017-03-21 VITALS — BP 152/98 | HR 89 | Temp 98.2°F | Ht 69.0 in | Wt 179.0 lb

## 2017-03-21 DIAGNOSIS — B20 Human immunodeficiency virus [HIV] disease: Secondary | ICD-10-CM

## 2017-03-21 DIAGNOSIS — I10 Essential (primary) hypertension: Secondary | ICD-10-CM

## 2017-03-21 DIAGNOSIS — Z23 Encounter for immunization: Secondary | ICD-10-CM

## 2017-03-21 NOTE — Progress Notes (Signed)
Subjective:   Chief complaint: fatigue   Patient ID: Hayden Hicks, male    DOB: Dec 11, 1980, 36 y.o.   MRN: [id number]  HPI  Hayden Hicks is a 36 y.o. male who had been doing superbly well on his  antiviral regimen, switched from [facility] to  [facility]  with undetectable viral load and health cd4 count..     He has unfortunately lost insurance and had to enroll into ADAP.  He is currently working for [employer] along with [employer].  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  I told him it was absolutely imperative that he get back in here in January to renew his ADAP program.  I am happy that he has not missed medications according to him and we will get new labs since he has not had them since last December.    Lab Results  Component Value Date   HIV1RNAQUANT <20 02/18/2015   Lab Results  Component Value Date   CD4TABS 810 05/11/2016   CD4TABS 490 02/18/2015   CD4TABS 1,080 01/20/2015   Past Medical History:  Diagnosis Date  . Arthritis   . Cryptosporidial gastroenteritis (HCC)   . Elevated troponin   . HIV infection (HCC)   . Hypertension   . Pericarditis     Past Surgical History:  Procedure Laterality Date  . KNEE ARTHROSCOPY      No family history on file.    Social History   Social History  . Marital status: Single    Spouse name: N/A  . Number of children: N/A  . Years of education: N/A   Social History Main Topics  . Smoking status: Never Smoker  . Smokeless tobacco: Never Used  . Alcohol use No     Comment: social  . Drug use: Yes    Types: Marijuana  . Sexual activity: Yes    Partners: Male   Other Topics Concern  . None   Social History Narrative  . None    No Known Allergies   Current Outpatient Prescriptions:  .  abacavir-dolutegravir-lamiVUDine (TRIUMEQ) 600-50-300 MG tablet, Take 1 tablet by mouth daily., Disp: 30 tablet, Rfl: 6 .  amLODipine-benazepril (LOTREL) 5-40  MG capsule, Take 1 capsule by mouth daily., Disp: 30 capsule, Rfl: 11 .  clonazePAM (KLONOPIN) 1 MG tablet, Take 1 tablet (1 mg total) by mouth daily as needed., Disp: 30 tablet, Rfl: 0     Review of Systems  Constitutional: Positive for fatigue. Negative for activity change, appetite change, chills, diaphoresis, fever and unexpected weight change.  HENT: Negative for congestion, sinus pressure, sneezing, sore throat and trouble swallowing.   Eyes: Negative for photophobia and visual disturbance.  Respiratory: Negative for cough, chest tightness, shortness of breath, wheezing and stridor.   Cardiovascular: Negative for chest pain, palpitations and leg swelling.  Gastrointestinal: Negative for abdominal distention, abdominal pain, anal bleeding, constipation, nausea and vomiting.  Genitourinary: Negative for difficulty urinating, dysuria, flank pain and hematuria.  Musculoskeletal: Negative for arthralgias, back pain, gait problem, joint swelling and myalgias.  Skin: Negative for color change, pallor, rash and wound.  Neurological: Negative for weakness.  Hematological: Negative for adenopathy. Does not bruise/bleed easily.  Psychiatric/Behavioral: Negative for agitation, behavioral problems, confusion, decreased concentration, dysphoric mood and sleep disturbance.       Objective:   Physical Exam  Constitutional: He is oriented to person, place, and time. He appears well-developed and well-nourished. No  distress.  HENT:  Head: Normocephalic and atraumatic.  Mouth/Throat: Oropharynx is clear and moist. No oropharyngeal exudate.  Eyes: Pupils are equal, round, and reactive to light. Conjunctivae and EOM are normal. No scleral icterus.  Neck: Normal range of motion. Neck supple. No JVD present.  Cardiovascular: Normal rate and regular rhythm.   Pulmonary/Chest: Effort normal and breath sounds normal. No respiratory distress. He has no wheezes.  Abdominal: He exhibits no distension and no  mass. There is no tenderness. There is no rebound and no guarding.  Musculoskeletal: He exhibits no edema or tenderness.  Lymphadenopathy:    He has no cervical adenopathy.  Neurological: He is alert and oriented to person, place, and time. He exhibits normal muscle tone. Coordination normal.  Skin: Skin is warm and dry. He is not diaphoretic. No erythema. No pallor.  Psychiatric: He has a normal mood and affect. His behavior is normal. Judgment and thought content normal.  Fatigued appearing          Assessment & Plan:  HIV: renew  TRIUMEQ RTC in January for ADAP renewal, labs and visit  HTN: Blood pressure has been at goal in the past but elevated today and worse. Will need to monitor   Vitals:   03/21/17 1046  BP: (!) 152/98  Pulse: 89  Temp: 98.2 F (36.8 C)

## 2017-03-22 LAB — COMPLETE METABOLIC PANEL WITH GFR
AG RATIO: 1.9 (calc) (ref 1.0–2.5)
ALT: 32 U/L (ref 9–46)
AST: 19 U/L (ref 10–40)
Albumin: 4.7 g/dL (ref 3.6–5.1)
Alkaline phosphatase (APISO): 59 U/L (ref 40–115)
BILIRUBIN TOTAL: 0.5 mg/dL (ref 0.2–1.2)
BUN: 13 mg/dL (ref 7–25)
CHLORIDE: 102 mmol/L (ref 98–110)
CO2: 28 mmol/L (ref 20–32)
Calcium: 9.7 mg/dL (ref 8.6–10.3)
Creat: 1.14 mg/dL (ref 0.60–1.35)
GFR, EST AFRICAN AMERICAN: 95 mL/min/{1.73_m2} (ref >=60)
GFR, Est Non African American: 82 mL/min/{1.73_m2} (ref >=60)
Globulin: 2.5 g/dL (calc) (ref 1.9–3.7)
Glucose, Bld: 85 mg/dL (ref 65–99)
POTASSIUM: 4.9 mmol/L (ref 3.5–5.3)
Sodium: 139 mmol/L (ref 135–146)
TOTAL PROTEIN: 7.2 g/dL (ref 6.1–8.1)

## 2017-03-22 LAB — CBC WITH DIFFERENTIAL/PLATELET
BASOS ABS: 49 {cells}/uL (ref 0–200)
Basophils Relative: 0.7 %
EOS ABS: 70 {cells}/uL (ref 15–500)
Eosinophils Relative: 1 %
HEMATOCRIT: 48 % (ref 38.5–50.0)
Hemoglobin: 17.3 g/dL — ABNORMAL HIGH (ref 13.2–17.1)
LYMPHS ABS: 1792 {cells}/uL (ref 850–3900)
MCH: 33.3 pg — AB (ref 27.0–33.0)
MCHC: 36 g/dL (ref 32.0–36.0)
MCV: 92.5 fL (ref 80.0–100.0)
MPV: 9.2 fL (ref 7.5–12.5)
Monocytes Relative: 7.7 %
Neutro Abs: 4550 cells/uL (ref 1500–7800)
Neutrophils Relative %: 65 %
Platelets: 233 10*3/uL (ref 140–400)
RBC: 5.19 10*6/uL (ref 4.20–5.80)
RDW: 12.4 % (ref 11.0–15.0)
Total Lymphocyte: 25.6 %
WBC: 7 10*3/uL (ref 3.8–10.8)
WBCMIX: 539 {cells}/uL (ref 200–950)

## 2017-03-22 LAB — T-HELPER CELL (CD4) - (RCID CLINIC ONLY)
CD4 % Helper T Cell: 24 % — ABNORMAL LOW (ref 33–55)
CD4 T CELL ABS: 420 /uL (ref 400–2700)

## 2017-03-22 LAB — URINE CYTOLOGY ANCILLARY ONLY
Chlamydia: NEGATIVE
NEISSERIA GONORRHEA: NEGATIVE

## 2017-03-22 LAB — RPR: RPR: NONREACTIVE

## 2017-03-26 LAB — HIV RNA, RTPCR W/R GT (RTI, PI,INT)
HIV 1 RNA Quant: <20 copies/mL
HIV-1 RNA Quant, Log: <1.3 Log copies/mL

## 2017-04-30 ENCOUNTER — Encounter: Payer: Self-pay | Admitting: Infectious Disease

## 2017-04-30 ENCOUNTER — Ambulatory Visit (INDEPENDENT_AMBULATORY_CARE_PROVIDER_SITE_OTHER): Payer: Self-pay | Admitting: Infectious Disease

## 2017-04-30 VITALS — BP 148/87 | HR 90 | Temp 98.3°F | Wt 184.0 lb

## 2017-04-30 DIAGNOSIS — Z79899 Other long term (current) drug therapy: Secondary | ICD-10-CM

## 2017-04-30 DIAGNOSIS — I10 Essential (primary) hypertension: Secondary | ICD-10-CM

## 2017-04-30 DIAGNOSIS — Z113 Encounter for screening for infections with a predominantly sexual mode of transmission: Secondary | ICD-10-CM

## 2017-04-30 DIAGNOSIS — B20 Human immunodeficiency virus [HIV] disease: Secondary | ICD-10-CM

## 2017-04-30 NOTE — Progress Notes (Signed)
Subjective:   Chief complaint: followup for HIV   Patient ID: Hayden Hicks, male    DOB: 10/14/1980, 36 y.o.   MRN: [id number]  HPI  Hayden Hicks is a 36 y.o. male who had been doing superbly well on his  antiviral regimen, [facility]  with undetectable viral load and health cd4 count..     He has unfortunately lost insurance and had to enroll into ADAP.  He is currently working for [employer] along with [employer].     He continues to maintain perfect adherence. He is here with his boyfriend who is HIV negative.  We reviewed all of his labs and I continue to be very happy with his adherence to ARV.  Lab Results  Component Value Date   HIV1RNAQUANT <20 NOT DETECTED 03/21/2017   Lab Results  Component Value Date   CD4TABS 420 03/21/2017   CD4TABS 810 05/11/2016   CD4TABS 490 02/18/2015    BP is up here a bit but he says this is due to stress. He HAS already kept BP log and let me review it. I will ask him to do so again.   Past Medical History:  Diagnosis Date  . Arthritis   . Cryptosporidial gastroenteritis (HCC)   . Elevated troponin   . HIV infection (HCC)   . Hypertension   . Pericarditis     Past Surgical History:  Procedure Laterality Date  . KNEE ARTHROSCOPY      No family history on file.    Social History   Socioeconomic History  . Marital status: Single    Spouse name: Not on file  . Number of children: Not on file  . Years of education: Not on file  . Highest education level: Not on file  Social Needs  . Financial resource strain: Not on file  . Food insecurity - worry: Not on file  . Food insecurity - inability: Not on file  . Transportation needs - medical: Not on file  . Transportation needs - non-medical: Not on file  Occupational History  . Not on file  Tobacco Use  . Smoking status: Never Smoker  . Smokeless tobacco: Never Used  Substance and Sexual Activity  . Alcohol use: No    Comment: social   . Drug use: Yes    Types: Marijuana  . Sexual activity: Yes    Partners: Male  Other Topics Concern  . Not on file  Social History Narrative  . Not on file    No Known Allergies   Current Outpatient Medications:  .  abacavir-dolutegravir-lamiVUDine (TRIUMEQ) 600-50-300 MG tablet, Take 1 tablet by mouth daily., Disp: 30 tablet, Rfl: 6 .  amLODipine-benazepril (LOTREL) 5-40 MG capsule, Take 1 capsule by mouth daily., Disp: 30 capsule, Rfl: 11 .  clonazePAM (KLONOPIN) 1 MG tablet, Take 1 tablet (1 mg total) by mouth daily as needed., Disp: 30 tablet, Rfl: 0     Review of Systems  Constitutional: Negative for activity change, appetite change, chills, diaphoresis, fever and unexpected weight change.  HENT: Negative for congestion, sinus pressure, sneezing, sore throat and trouble swallowing.   Eyes: Negative for photophobia and visual disturbance.  Respiratory: Negative for cough, chest tightness, shortness of breath, wheezing and stridor.   Cardiovascular: Negative for chest pain, palpitations and leg swelling.  Gastrointestinal: Negative for abdominal distention, abdominal pain, anal bleeding, constipation, nausea and vomiting.  Genitourinary: Negative for difficulty urinating, dysuria, flank pain and hematuria.  Musculoskeletal: Negative for arthralgias, back pain, gait problem, joint swelling and myalgias.  Skin: Negative for color change, pallor, rash and wound.  Neurological: Negative for weakness.  Hematological: Negative for adenopathy. Does not bruise/bleed easily.  Psychiatric/Behavioral: Negative for agitation, behavioral problems, confusion, decreased concentration, dysphoric mood and sleep disturbance. The patient is nervous/anxious.        Objective:   Physical Exam  Constitutional: He is oriented to person, place, and time. He appears well-developed and well-nourished. No distress.  HENT:  Head: Normocephalic and atraumatic.  Mouth/Throat: Oropharynx is clear and  moist. No oropharyngeal exudate.  Eyes: Conjunctivae and EOM are normal. Pupils are equal, round, and reactive to light. No scleral icterus.  Neck: Normal range of motion. Neck supple. No JVD present.  Cardiovascular: Normal rate and regular rhythm.  Pulmonary/Chest: Effort normal and breath sounds normal. No respiratory distress. He has no wheezes.  Abdominal: He exhibits no distension and no mass. There is no tenderness. There is no rebound and no guarding.  Musculoskeletal: He exhibits no edema or tenderness.  Lymphadenopathy:    He has no cervical adenopathy.  Neurological: He is alert and oriented to person, place, and time. He exhibits normal muscle tone. Coordination normal.  Skin: Skin is warm and dry. He is not diaphoretic. No erythema. No pallor.  Psychiatric: He has a normal mood and affect. His behavior is normal. Judgment and thought content normal.  Fatigued appearing  Nursing note and vitals reviewed.         Assessment & Plan:  HIV: renew  TRIUMEQ RTC to see Olegario MessierKathy January for ADAP renewal., I will plan on getting labs and seeing him in July when ADAP renewal is due again. We discussed potential switch to BIKTARVY  HTN: Blood pressure has been at goal in the past. He has anxiety coming to our clinic. WE will get another set of data from his home BP monitoring   There were no vitals filed for this visit.  Smoking: infrequent but would like to get him to cut this out altogether  Anxiety: he has been off klonopin for years and is managing this reasonably well without this medicine. I am taking off his med list  I spent greater than 25 minutes with the patient including greater than 50% of time in face to face counsel of the patient and in coordination of his care. I reviewed findings from DAD study, controversy re Abacavir and CV risk and reasons to consider changing off of ABC regimen. Also counseled re tobacco use and need to stop it.

## 2017-06-04 ENCOUNTER — Ambulatory Visit: Payer: Self-pay

## 2017-06-06 ENCOUNTER — Ambulatory Visit: Payer: BLUE CROSS/BLUE SHIELD | Admitting: Infectious Disease

## 2017-06-08 ENCOUNTER — Encounter: Payer: Self-pay | Admitting: Infectious Disease

## 2017-10-13 ENCOUNTER — Other Ambulatory Visit: Payer: Self-pay | Admitting: Infectious Disease

## 2017-10-13 DIAGNOSIS — B20 Human immunodeficiency virus [HIV] disease: Secondary | ICD-10-CM

## 2017-10-20 ENCOUNTER — Emergency Department
Admission: EM | Admit: 2017-10-20 | Discharge: 2017-10-20 | Disposition: A | Discharge status: Home / Self Care | Payer: Self-pay | Attending: Emergency Medicine | Admitting: Emergency Medicine

## 2017-10-20 ENCOUNTER — Other Ambulatory Visit: Payer: Self-pay

## 2017-10-20 ENCOUNTER — Encounter: Payer: Self-pay | Admitting: Emergency Medicine

## 2017-10-20 DIAGNOSIS — M7711 Lateral epicondylitis, right elbow: Secondary | ICD-10-CM | POA: Insufficient documentation

## 2017-10-20 DIAGNOSIS — B20 Human immunodeficiency virus [HIV] disease: Secondary | ICD-10-CM | POA: Insufficient documentation

## 2017-10-20 DIAGNOSIS — Z79899 Other long term (current) drug therapy: Secondary | ICD-10-CM | POA: Insufficient documentation

## 2017-10-20 DIAGNOSIS — F17210 Nicotine dependence, cigarettes, uncomplicated: Secondary | ICD-10-CM | POA: Insufficient documentation

## 2017-10-20 DIAGNOSIS — I10 Essential (primary) hypertension: Secondary | ICD-10-CM | POA: Insufficient documentation

## 2017-10-20 MED ORDER — PREDNISONE 10 MG PO TABS: ORAL_TABLET | ORAL | 0 refills | Status: DC

## 2017-10-20 NOTE — ED Notes (Signed)
Pt reports that right arm has been hurting for 2+ months - pt denies injury - he states he is unable to pick up a glass without his elbow feeling pressure and shooting pain

## 2017-10-20 NOTE — ED Triage Notes (Signed)
Pt to ED via POV c/o right elbow pain x 1.5 months. Pt is in NAD at this time.

## 2017-10-20 NOTE — ED Provider Notes (Signed)
[facility] Emergency Department Provider Note  ____________________________________________   First MD Initiated Contact with Patient 10/20/17 1653     (approximate)  I have reviewed the triage vital signs and the nursing notes.   HISTORY  Chief Complaint Arm Pain    HPI Hayden Hicks is a 37 y.o. male resents emergency department complaining of right elbow pain months.  He states he does not remember an injury.  He states the pain is worse with bending and extending the arm.  He states when he goes to take someone's hand the tendon area of really hurts.  He denies any swelling in this area.  He denies fever or chills  Past Medical History:  Diagnosis Date  . Arthritis   . Cryptosporidial gastroenteritis (HCC)   . Elevated troponin   . HIV infection (HCC)   . Hypertension   . Pericarditis     Patient Active Problem List   Diagnosis Date Noted  . Hypertension, essential 05/11/2016  . Cryptosporidial gastroenteritis (HCC)   . Pericarditis   . Elevated troponin   . Annual physical exam 08/23/2011  . Polycythemia 02/20/2011  . Bad breath 02/20/2011  . Flu-like symptoms 02/20/2011  . HTN (hypertension) 09/07/2010  . Human immunodeficiency virus (HIV) disease (HCC) 08/15/2010  . ADJ DISORDER WITH MIXED ANXIETY & DEPRESSED MOOD 08/15/2010  . ARTHROSCOPY, KNEE, HX OF 08/15/2010    Past Surgical History:  Procedure Laterality Date  . KNEE ARTHROSCOPY      Prior to Admission medications   Medication Sig Start Date End Date Taking? Authorizing Provider  amLODipine-benazepril (LOTREL) 5-40 MG capsule Take 1 capsule by mouth daily. 05/11/16   Randall Hiss, MD  predniSONE (DELTASONE) 10 MG tablet Take 6 pills on day 1 and decrease by 1 pill each day until all are gone.  Take these early in the morning. 10/20/17   Faythe Ghee, PA-C  TRIUMEQ 600-50-300 MG tablet TAKE 1 TABLET BY MOUTH DAILY 10/15/17   Daiva Eves, Lisette Grinder, MD     Allergies Patient has no known allergies.  History reviewed. No pertinent family history.  Social History Social History   Tobacco Use  . Smoking status: Current Some Day Smoker    Types: Cigarettes  . Smokeless tobacco: Never Used  Substance Use Topics  . Alcohol use: No    Comment: social  . Drug use: Yes    Types: Marijuana    Review of Systems  Constitutional: No fever/chills Eyes: No visual changes. ENT: No sore throat. Respiratory: Denies cough Genitourinary: Negative for dysuria. Musculoskeletal: Negative for back pain.  Positive for right elbow pain Skin: Negative for rash.    ____________________________________________   PHYSICAL EXAM:  VITAL SIGNS: ED Triage Vitals  Enc Vitals Group     BP 10/20/17 1635 (!) 165/99     Pulse Rate 10/20/17 1635 93     Resp 10/20/17 1635 16     Temp 10/20/17 1635 99.1 F (37.3 C)     Temp Source 10/20/17 1635 Oral     SpO2 10/20/17 1635 98 %     Weight --      Height --      Head Circumference --      Peak Flow --      Pain Score 10/20/17 1636 5     Pain Loc --      Pain Edu? --      Excl. in GC? --     Constitutional: Alert  and oriented. Well appearing and in no acute distress. Eyes: Conjunctivae are normal.  Head: Atraumatic. Nose: No congestion/rhinnorhea. Mouth/Throat: Mucous membranes are moist.   Cardiovascular: Normal rate, regular rhythm. Respiratory: Normal respiratory effort.  No retractions GU: deferred Musculoskeletal: FROM all extremities, warm and well perfused.  Pain is reproduced during the handshake.  Pain is reproduced with supination of the elbow.  The elbow is tender at the lateral epicondyle.  He is neurovascularly intact Neurologic:  Normal speech and language.  Skin:  Skin is warm, dry and intact. No rash noted. Psychiatric: Mood and affect are normal. Speech and behavior are normal.  ____________________________________________   LABS (all labs ordered are listed, but only  abnormal results are displayed)  Labs Reviewed - No data to display ____________________________________________   ____________________________________________  RADIOLOGY    ____________________________________________   PROCEDURES  Procedure(s) performed: No  Procedures    ____________________________________________   INITIAL IMPRESSION / ASSESSMENT AND PLAN / ED COURSE  Pertinent labs & imaging results that were available during my care of the patient were reviewed by me and considered in my medical decision making (see chart for details).  Patient is 37 year old male presents emergency department complaining of right elbow pain for 1 to 2 months.  He denies any known injury.  Pain is reproduced with movement.  On physical exam the right elbow is tender at the lateral epicondyle.  Pain is reproduced with supination of the elbow.  The patient has pain that is reproduced when I shake his hand.  He is neurovascularly intact.  Spleen exam findings to the patient.  Explained to him he has tendinitis.  He was given a prescription for prednisone 10 mg Dosepak.  He was instructed to buy an over-the-counter tennis elbow brace.  Apply ice.  If he is not better in 5 to 7 days he should follow-up with [facility].  He can call and make an appointment or he can go to the walk-in clinic.  Patient states he understands will try to comply with the recommendations he was discharged in stable condition     As part of my medical decision making, I reviewed the following data within the electronic MEDICAL RECORD NUMBER Nursing notes reviewed and incorporated, Notes from prior ED visits and [place] Controlled Substance Database  ____________________________________________   FINAL CLINICAL IMPRESSION(S) / ED DIAGNOSES  Final diagnoses:  Lateral epicondylitis of right elbow      NEW MEDICATIONS STARTED DURING THIS VISIT:  New Prescriptions   PREDNISONE (DELTASONE) 10 MG TABLET     Take 6 pills on day 1 and decrease by 1 pill each day until all are gone.  Take these early in the morning.     Note:  This document was prepared using Dragon voice recognition software and may include unintentional dictation errors.    Faythe Ghee, PA-C 10/20/17 1728    Minna Antis, MD 10/20/17 2023

## 2017-10-20 NOTE — Discharge Instructions (Addendum)
Follow-up with [facility] if you are not better in 5 to 7 days.  Use medication as prescribed.  Wear the tennis elbow strap to reduce the inflammation on the tendon.  Apply ice to the area this will also help.

## 2017-11-10 ENCOUNTER — Other Ambulatory Visit: Payer: Self-pay | Admitting: Infectious Disease

## 2017-11-10 DIAGNOSIS — B20 Human immunodeficiency virus [HIV] disease: Secondary | ICD-10-CM

## 2017-11-12 ENCOUNTER — Other Ambulatory Visit: Payer: Self-pay

## 2017-11-12 DIAGNOSIS — B20 Human immunodeficiency virus [HIV] disease: Secondary | ICD-10-CM

## 2017-11-12 MED ORDER — ABACAVIR-DOLUTEGRAVIR-LAMIVUD 600-50-300 MG PO TABS: 1.0000 | ORAL_TABLET | Freq: Every day | ORAL | 0 refills | Status: DC

## 2017-11-20 ENCOUNTER — Other Ambulatory Visit: Payer: Self-pay | Admitting: *Deleted

## 2017-11-20 DIAGNOSIS — B20 Human immunodeficiency virus [HIV] disease: Secondary | ICD-10-CM

## 2017-11-20 MED ORDER — ABACAVIR-DOLUTEGRAVIR-LAMIVUD 600-50-300 MG PO TABS: 1.0000 | ORAL_TABLET | Freq: Every day | ORAL | 0 refills | Status: DC

## 2017-12-05 ENCOUNTER — Ambulatory Visit: Payer: Self-pay | Admitting: Infectious Disease

## 2017-12-20 ENCOUNTER — Ambulatory Visit: Payer: Self-pay | Admitting: Infectious Disease

## 2017-12-20 ENCOUNTER — Encounter: Payer: Self-pay | Admitting: Infectious Disease

## 2018-02-05 ENCOUNTER — Encounter: Payer: Self-pay | Admitting: Emergency Medicine

## 2018-02-05 DIAGNOSIS — Z5321 Procedure and treatment not carried out due to patient leaving prior to being seen by health care provider: Secondary | ICD-10-CM | POA: Insufficient documentation

## 2018-02-05 DIAGNOSIS — M79603 Pain in arm, unspecified: Secondary | ICD-10-CM | POA: Insufficient documentation

## 2018-02-05 NOTE — ED Triage Notes (Signed)
Pt c/o upper and lower bilateral limb pain x1 week. Pt describes pain as shooting when he does activities like driving or when he wakes up. Pt denies injury. No swelling or deformity noted.

## 2018-02-06 ENCOUNTER — Emergency Department
Admission: EM | Admit: 2018-02-06 | Discharge: 2018-02-06 | Discharge status: Against Medical Advice | Payer: Self-pay | Attending: Emergency Medicine | Admitting: Emergency Medicine

## 2018-02-12 ENCOUNTER — Ambulatory Visit (INDEPENDENT_AMBULATORY_CARE_PROVIDER_SITE_OTHER): Payer: Self-pay | Admitting: Internal Medicine

## 2018-02-12 ENCOUNTER — Encounter: Payer: Self-pay | Admitting: Internal Medicine

## 2018-02-12 DIAGNOSIS — I10 Essential (primary) hypertension: Secondary | ICD-10-CM

## 2018-02-12 DIAGNOSIS — F17210 Nicotine dependence, cigarettes, uncomplicated: Secondary | ICD-10-CM | POA: Insufficient documentation

## 2018-02-12 DIAGNOSIS — Z23 Encounter for immunization: Secondary | ICD-10-CM

## 2018-02-12 DIAGNOSIS — B20 Human immunodeficiency virus [HIV] disease: Secondary | ICD-10-CM

## 2018-02-12 MED ORDER — ABACAVIR-DOLUTEGRAVIR-LAMIVUD 600-50-300 MG PO TABS: 1.0000 | ORAL_TABLET | Freq: Every day | ORAL | 11 refills | Status: DC

## 2018-02-12 NOTE — Assessment & Plan Note (Signed)
Even though he only rarely smokes I did encourage him to quit completely.

## 2018-02-12 NOTE — Progress Notes (Signed)
Patient Active Problem List   Diagnosis Date Noted  . Human immunodeficiency virus (HIV) disease (HCC) 08/15/2010    Priority: High  . Cigarette smoker 02/12/2018  . Cryptosporidial gastroenteritis (HCC)   . Pericarditis   . Elevated troponin   . Annual physical exam 08/23/2011  . Polycythemia 02/20/2011  . Bad breath 02/20/2011  . Flu-like symptoms 02/20/2011  . HTN (hypertension) 09/07/2010  . ADJ DISORDER WITH MIXED ANXIETY & DEPRESSED MOOD 08/15/2010  . ARTHROSCOPY, KNEE, HX OF 08/15/2010    Patient's Medications  New Prescriptions   No medications on file  Previous Medications   No medications on file  Modified Medications   Modified Medication Previous Medication   ABACAVIR-DOLUTEGRAVIR-LAMIVUDINE (TRIUMEQ) 600-50-300 MG TABLET abacavir-dolutegravir-lamiVUDine (TRIUMEQ) 600-50-300 MG tablet      Take 1 tablet by mouth daily.    Take 1 tablet by mouth daily.  Discontinued Medications   AMLODIPINE-BENAZEPRIL (LOTREL) 5-40 MG CAPSULE    Take 1 capsule by mouth daily.   PREDNISONE (DELTASONE) 10 MG TABLET    Take 6 pills on day 1 and decrease by 1 pill each day until all are gone.  Take these early in the morning.   TRIUMEQ 600-50-300 MG TABLET    TAKE 1 TABLET BY MOUTH DAILY    Subjective: Hayden Hicks is in for his routine HIV follow-up visit.  He was recently told that he can only get 1 refill of his Triumeq until he was seen by the doctor.  However, this has not caused him to miss a dose.  He does not recall ever missing a dose.  He is not on any other medications.  He stopped his blood pressure medication because he felt like his blood pressure was okay without it.  He says that he only smokes about 1 cigarette every 3 months.  He is feeling well.  Review of Systems: Review of Systems  Constitutional: Negative for chills, diaphoresis, fever, malaise/fatigue and weight loss.  HENT: Negative for sore throat.   Respiratory: Negative for cough, sputum production and  shortness of breath.   Cardiovascular: Negative for chest pain.  Gastrointestinal: Negative for abdominal pain, diarrhea, heartburn, nausea and vomiting.  Genitourinary: Negative for dysuria and frequency.  Musculoskeletal: Negative for joint pain and myalgias.  Skin: Negative for rash.  Neurological: Negative for dizziness and headaches.  Psychiatric/Behavioral: Negative for depression and substance abuse. The patient is not nervous/anxious.     Past Medical History:  Diagnosis Date  . Arthritis   . Cryptosporidial gastroenteritis (HCC)   . Elevated troponin   . HIV infection (HCC)   . Hypertension   . Pericarditis     Social History   Tobacco Use  . Smoking status: Current Some Day Smoker    Types: Cigarettes  . Smokeless tobacco: Never Used  . Tobacco comment: occ  Substance Use Topics  . Alcohol use: No    Comment: social  . Drug use: Yes    Types: Marijuana    No family history on file.  No Known Allergies  Health Maintenance  Topic Date Due  . TETANUS/TDAP  09/12/1999  . INFLUENZA VACCINE  12/27/2017  . HIV Screening  Completed    Objective:  Vitals:   02/12/18 1054  BP: 135/80  Pulse: 67  Temp: 97.7 F (36.5 C)  TempSrc: Oral  Weight: 182 lb (82.6 kg)   Body mass index is 26.88 kg/m.  Physical Exam  Constitutional: He is oriented to  person, place, and time.  He is in good spirits.  HENT:  Mouth/Throat: No oropharyngeal exudate.  Eyes: Conjunctivae are normal.  Cardiovascular: Normal rate, regular rhythm and normal heart sounds.  No murmur heard. Pulmonary/Chest: Effort normal and breath sounds normal.  Abdominal: Soft. He exhibits no distension. There is no tenderness.  Musculoskeletal: Normal range of motion.  Neurological: He is alert and oriented to person, place, and time.  Skin: No rash noted.  Psychiatric: He has a normal mood and affect.    Lab Results Lab Results  Component Value Date   WBC 7.0 03/21/2017   HGB 17.3 (H)  03/21/2017   HCT 48.0 03/21/2017   MCV 92.5 03/21/2017   PLT 233 03/21/2017    Lab Results  Component Value Date   CREATININE 1.14 03/21/2017   BUN 13 03/21/2017   NA 139 03/21/2017   K 4.9 03/21/2017   CL 102 03/21/2017   CO2 28 03/21/2017    Lab Results  Component Value Date   ALT 32 03/21/2017   AST 19 03/21/2017   ALKPHOS 56 05/11/2016   BILITOT 0.5 03/21/2017    Lab Results  Component Value Date   CHOL 186 06/10/2014   HDL 58 06/10/2014   LDLCALC 104 (H) 06/10/2014   TRIG 121 06/10/2014   CHOLHDL 3.2 06/10/2014   Lab Results  Component Value Date   LABRPR NON-REACTIVE 03/21/2017   HIV 1 RNA Quant (copies/mL)  Date Value  03/21/2017 <20 NOT DETECTED  02/18/2015 <20  01/20/2015 <20   CD4 T Cell Abs (/uL)  Date Value  03/21/2017 420  05/11/2016 810  02/18/2015 490     Problem List Items Addressed This Visit      High   Human immunodeficiency virus (HIV) disease (HCC)    His infection is under excellent, long-term control.  He received his influenza vaccination today.  He will continue Triumeq and follow-up after lab work in 1 year.      Relevant Medications   abacavir-dolutegravir-lamiVUDine (TRIUMEQ) 600-50-300 MG tablet   Other Relevant Orders   CBC   T-helper cell (CD4)- ([facility] clinic only)   Comprehensive metabolic panel   Lipid panel   RPR   HIV-1 RNA quant-no reflex-bld     Unprioritized   Cigarette smoker    Even though he only rarely smokes I did encourage him to quit completely.      HTN (hypertension)    His blood pressure is under reasonable control off of medication.       Other Visit Diagnoses    Human immunodeficiency virus (HIV) disease (HCC)       Relevant Medications   abacavir-dolutegravir-lamiVUDine (TRIUMEQ) 600-50-300 MG tablet        Cliffton Asters, MD [facility] [phone] pager   [phone] cell 02/12/2018, 11:10 AM

## 2018-02-12 NOTE — Assessment & Plan Note (Signed)
His blood pressure is under reasonable control off of medication.

## 2018-02-12 NOTE — Assessment & Plan Note (Signed)
His infection is under excellent, long-term control.  He received his influenza vaccination today.  He will continue Triumeq and follow-up after lab work in 1 year.

## 2018-06-25 ENCOUNTER — Ambulatory Visit: Payer: Self-pay

## 2018-06-28 ENCOUNTER — Encounter: Payer: Self-pay | Admitting: Infectious Disease

## 2018-07-02 ENCOUNTER — Telehealth: Payer: Self-pay | Admitting: *Deleted

## 2018-07-02 NOTE — Telephone Encounter (Signed)
Yes, [facility], do the right thing!

## 2018-07-02 NOTE — Telephone Encounter (Signed)
Received call from [employer] They state they mailed the patietn's Triumeq to his local pharmacy, but something happened during transfer and the local pharmacy does not have it to dispense (despite there being a signature of receipt). ADAP will not pay for another bottle until 2/19. Pharmacist is asking if we have an extra bottle or a coupon card to use to pay for another bottle of Triumeq.  No medication available here, coupon cards only work when patient is insured.   Pharmacy will work out this with the patient, with pharmacist stating they may have to eat the cost of medication. Andree Coss, RN

## 2018-07-10 NOTE — Telephone Encounter (Signed)
RN spoke with [employer] today 2/12. Patient received a bottle of Triumeq later the same day we were notified of the issue. Andree Coss, RN

## 2018-11-14 ENCOUNTER — Other Ambulatory Visit: Payer: Self-pay | Admitting: Infectious Disease

## 2018-11-14 DIAGNOSIS — B20 Human immunodeficiency virus [HIV] disease: Secondary | ICD-10-CM

## 2018-12-02 ENCOUNTER — Encounter: Payer: Self-pay | Admitting: Infectious Disease

## 2018-12-02 ENCOUNTER — Ambulatory Visit: Payer: Self-pay

## 2018-12-02 ENCOUNTER — Other Ambulatory Visit: Payer: Self-pay

## 2019-02-17 ENCOUNTER — Other Ambulatory Visit: Payer: Self-pay

## 2019-02-17 ENCOUNTER — Ambulatory Visit (INDEPENDENT_AMBULATORY_CARE_PROVIDER_SITE_OTHER): Payer: Self-pay | Admitting: Infectious Disease

## 2019-02-17 ENCOUNTER — Encounter: Payer: Self-pay | Admitting: Infectious Disease

## 2019-02-17 VITALS — BP 150/99 | HR 93 | Temp 98.6°F

## 2019-02-17 DIAGNOSIS — B20 Human immunodeficiency virus [HIV] disease: Secondary | ICD-10-CM

## 2019-02-17 DIAGNOSIS — F4323 Adjustment disorder with mixed anxiety and depressed mood: Secondary | ICD-10-CM

## 2019-02-17 DIAGNOSIS — I10 Essential (primary) hypertension: Secondary | ICD-10-CM

## 2019-02-17 DIAGNOSIS — F17210 Nicotine dependence, cigarettes, uncomplicated: Secondary | ICD-10-CM

## 2019-02-17 DIAGNOSIS — Z23 Encounter for immunization: Secondary | ICD-10-CM

## 2019-02-17 MED ORDER — TRIUMEQ 600-50-300 MG PO TABS: 1.0000 | ORAL_TABLET | Freq: Every day | ORAL | 11 refills | Status: DC

## 2019-02-17 NOTE — Progress Notes (Signed)
Subjective:   Chief complaint: followup for HIV disease   Patient ID: Hayden Hicks, male    DOB: Sep 25, 1980, 38 y.o.   MRN: [id number]  HPI  Hayden Hicks is a 38 y.o. male who had been doing superbly well on his  antiviral regimen, switched from [facility] to  [facility]  with undetectable viral load and health cd4 count.Marland Kitchen    He was seen in clinic last September 2019 by my partner Dr. Orvan Falconer but he actually did not have labs drawn at that time.  He is continued on Triumeq since then.  We will get labs today.  We did discuss the controversy surrounding abacavir and cardiovascular disease.  I proposed idea of switching him to [employer].  He would like to remain on Triumeq for now.  He is himself to stop smoking but his partner who accompanied him does smoke.       Past Medical History:  Diagnosis Date  . Arthritis   . Cryptosporidial gastroenteritis (HCC)   . Elevated troponin   . HIV infection (HCC)   . Hypertension   . Pericarditis     Past Surgical History:  Procedure Laterality Date  . KNEE ARTHROSCOPY      No family history on file.    Social History   Socioeconomic History  . Marital status: Single    Spouse name: Not on file  . Number of children: Not on file  . Years of education: Not on file  . Highest education level: Not on file  Occupational History  . Not on file  Social Needs  . Financial resource strain: Not on file  . Food insecurity    Worry: Not on file    Inability: Not on file  . Transportation needs    Medical: Not on file    Non-medical: Not on file  Tobacco Use  . Smoking status: Current Some Day Smoker    Types: Cigarettes  . Smokeless tobacco: Never Used  . Tobacco comment: occ  Substance and Sexual Activity  . Alcohol use: No    Comment: social  . Drug use: Yes    Types: Marijuana  . Sexual activity: Yes    Partners: Male  Lifestyle  . Physical activity    Days per week: Not on file    Minutes per  session: Not on file  . Stress: Not on file  Relationships  . Social Musician on phone: Not on file    Gets together: Not on file    Attends religious service: Not on file    Active member of club or organization: Not on file    Attends meetings of clubs or organizations: Not on file    Relationship status: Not on file  Other Topics Concern  . Not on file  Social History Narrative  . Not on file    No Known Allergies   Current Outpatient Medications:  .  TRIUMEQ 600-50-300 MG tablet, TAKE 1 TABLET BY MOUTH DAILY., Disp: 30 tablet, Rfl: 3     Review of Systems  Constitutional: Negative for activity change, appetite change, chills, diaphoresis, fever and unexpected weight change.  HENT: Negative for congestion, sinus pressure, sneezing, sore throat and trouble swallowing.   Eyes: Negative for photophobia and visual disturbance.  Respiratory: Negative for cough, chest tightness, shortness of breath, wheezing and stridor.   Cardiovascular: Negative for chest pain, palpitations and leg swelling.  Gastrointestinal: Negative for abdominal distention, abdominal pain, anal  bleeding, constipation, nausea and vomiting.  Genitourinary: Negative for difficulty urinating, dysuria, flank pain and hematuria.  Musculoskeletal: Negative for arthralgias, back pain, gait problem, joint swelling and myalgias.  Skin: Negative for color change, pallor, rash and wound.  Neurological: Negative for weakness.  Hematological: Negative for adenopathy. Does not bruise/bleed easily.  Psychiatric/Behavioral: Negative for agitation, behavioral problems, confusion, decreased concentration, dysphoric mood and sleep disturbance.       Objective:   Physical Exam  Constitutional: He is oriented to person, place, and time. He appears well-developed and well-nourished. No distress.  HENT:  Head: Normocephalic and atraumatic.  Mouth/Throat: Oropharynx is clear and moist. No oropharyngeal exudate.   Eyes: Pupils are equal, round, and reactive to light. Conjunctivae and EOM are normal. No scleral icterus.  Neck: Normal range of motion. Neck supple. No JVD present.  Cardiovascular: Normal rate and regular rhythm.  Pulmonary/Chest: Effort normal and breath sounds normal. No respiratory distress. He has no wheezes.  Abdominal: Soft. He exhibits no distension.  Musculoskeletal:        General: No tenderness or edema.  Lymphadenopathy:    He has no cervical adenopathy.  Neurological: He is alert and oriented to person, place, and time. He exhibits normal muscle tone. Coordination normal.  Skin: Skin is warm and dry. He is not diaphoretic. No erythema. No pallor.  Psychiatric: He has a normal mood and affect. His behavior is normal. Judgment and thought content normal.  Fatigued appearing  Nursing note and vitals reviewed.         Assessment & Plan:  HIV: renew  TRIUMEQ   HTN: Blood pressure has been at goal in the past. He has anxiety coming to our clinic. WE will get another set of data from his home BP monitoring  HTN: I would like him to keep BP log BP quite high today though perhaps some degree of white coat HTN Vitals:   02/17/19 1545  BP: (!) 150/99  Pulse: 93  Temp: 98.6 F (37 C)    Smoking: claims to have quit but has second hand smoke  Anxiety: he has been off klonopin for years and is managing this reasonably well without this medicine.

## 2019-02-19 LAB — HELPER T-LYMPH-CD4 (ARMC ONLY)
% CD 4 Pos. Lymph.: 30.7 % — ABNORMAL LOW (ref 30.8–58.5)
Absolute CD 4 Helper: 768 /uL (ref 359–1519)
Basophils Absolute: 0 10*3/uL (ref 0.0–0.2)
Basos: 1 %
EOS (ABSOLUTE): 0.1 10*3/uL (ref 0.0–0.4)
Eos: 1 %
Hematocrit: 48 % (ref 37.5–51.0)
Hemoglobin: 16.8 g/dL (ref 13.0–17.7)
Immature Grans (Abs): 0 10*3/uL (ref 0.0–0.1)
Immature Granulocytes: 0 %
Lymphocytes Absolute: 2.5 10*3/uL (ref 0.7–3.1)
Lymphs: 33 %
MCH: 33.4 pg — ABNORMAL HIGH (ref 26.6–33.0)
MCHC: 35 g/dL (ref 31.5–35.7)
MCV: 95 fL (ref 79–97)
Monocytes Absolute: 0.6 10*3/uL (ref 0.1–0.9)
Monocytes: 8 %
Neutrophils Absolute: 4.3 10*3/uL (ref 1.4–7.0)
Neutrophils: 57 %
Platelets: 264 10*3/uL (ref 150–450)
RBC: 5.03 x10E6/uL (ref 4.14–5.80)
RDW: 13 % (ref 11.6–15.4)
WBC: 7.5 10*3/uL (ref 3.4–10.8)

## 2019-02-21 LAB — LIPID PANEL
Cholesterol: 211 mg/dL — ABNORMAL HIGH (ref <200)
HDL: 44 mg/dL (ref >=40)
LDL Cholesterol (Calc): 111 mg/dL (calc) — ABNORMAL HIGH
Non-HDL Cholesterol (Calc): 167 mg/dL (calc) — ABNORMAL HIGH (ref <130)
Total CHOL/HDL Ratio: 4.8 (calc) (ref <5.0)
Triglycerides: 389 mg/dL — ABNORMAL HIGH (ref <150)

## 2019-02-21 LAB — CBC WITH DIFFERENTIAL/PLATELET
Absolute Monocytes: 578 cells/uL (ref 200–950)
Basophils Absolute: 30 cells/uL (ref 0–200)
Basophils Relative: 0.4 %
Eosinophils Absolute: 98 cells/uL (ref 15–500)
Eosinophils Relative: 1.3 %
HCT: 46.8 % (ref 38.5–50.0)
Hemoglobin: 16.6 g/dL (ref 13.2–17.1)
Lymphs Abs: 2498 cells/uL (ref 850–3900)
MCH: 33.1 pg — ABNORMAL HIGH (ref 27.0–33.0)
MCHC: 35.5 g/dL (ref 32.0–36.0)
MCV: 93.4 fL (ref 80.0–100.0)
MPV: 9.2 fL (ref 7.5–12.5)
Monocytes Relative: 7.7 %
Neutro Abs: 4298 cells/uL (ref 1500–7800)
Neutrophils Relative %: 57.3 %
Platelets: 267 10*3/uL (ref 140–400)
RBC: 5.01 10*6/uL (ref 4.20–5.80)
RDW: 12.6 % (ref 11.0–15.0)
Total Lymphocyte: 33.3 %
WBC: 7.5 10*3/uL (ref 3.8–10.8)

## 2019-02-21 LAB — COMPLETE METABOLIC PANEL WITH GFR
AG Ratio: 1.9 (calc) (ref 1.0–2.5)
ALT: 30 U/L (ref 9–46)
AST: 17 U/L (ref 10–40)
Albumin: 4.6 g/dL (ref 3.6–5.1)
Alkaline phosphatase (APISO): 64 U/L (ref 36–130)
BUN: 14 mg/dL (ref 7–25)
CO2: 31 mmol/L (ref 20–32)
Calcium: 10.3 mg/dL (ref 8.6–10.3)
Chloride: 101 mmol/L (ref 98–110)
Creat: 1.17 mg/dL (ref 0.60–1.35)
GFR, Est African American: 91 mL/min/{1.73_m2} (ref >=60)
GFR, Est Non African American: 79 mL/min/{1.73_m2} (ref >=60)
Globulin: 2.4 g/dL (calc) (ref 1.9–3.7)
Glucose, Bld: 111 mg/dL — ABNORMAL HIGH (ref 65–99)
Potassium: 4 mmol/L (ref 3.5–5.3)
Sodium: 141 mmol/L (ref 135–146)
Total Bilirubin: 0.6 mg/dL (ref 0.2–1.2)
Total Protein: 7 g/dL (ref 6.1–8.1)

## 2019-02-21 LAB — RPR: RPR Ser Ql: NONREACTIVE

## 2019-02-21 LAB — HIV-1 RNA QUANT-NO REFLEX-BLD
HIV 1 RNA Quant: <20 copies/mL — AB
HIV-1 RNA Quant, Log: <1.3 Log copies/mL — AB

## 2019-03-05 ENCOUNTER — Other Ambulatory Visit: Payer: Self-pay | Admitting: *Deleted

## 2019-03-05 DIAGNOSIS — Z20822 Contact with and (suspected) exposure to covid-19: Secondary | ICD-10-CM

## 2019-03-06 LAB — NOVEL CORONAVIRUS, NAA: SARS-CoV-2, NAA: NOT DETECTED

## 2019-04-13 ENCOUNTER — Emergency Department
Admission: EM | Admit: 2019-04-13 | Discharge: 2019-04-13 | Disposition: A | Discharge status: Home / Self Care | Payer: Self-pay | Attending: Emergency Medicine | Admitting: Emergency Medicine

## 2019-04-13 ENCOUNTER — Encounter: Payer: Self-pay | Admitting: Emergency Medicine

## 2019-04-13 ENCOUNTER — Emergency Department: Payer: Self-pay

## 2019-04-13 ENCOUNTER — Other Ambulatory Visit: Payer: Self-pay

## 2019-04-13 DIAGNOSIS — Z20828 Contact with and (suspected) exposure to other viral communicable diseases: Secondary | ICD-10-CM | POA: Insufficient documentation

## 2019-04-13 DIAGNOSIS — Z20822 Contact with and (suspected) exposure to covid-19: Secondary | ICD-10-CM

## 2019-04-13 DIAGNOSIS — F17210 Nicotine dependence, cigarettes, uncomplicated: Secondary | ICD-10-CM | POA: Insufficient documentation

## 2019-04-13 DIAGNOSIS — R05 Cough: Secondary | ICD-10-CM | POA: Insufficient documentation

## 2019-04-13 DIAGNOSIS — I10 Essential (primary) hypertension: Secondary | ICD-10-CM | POA: Insufficient documentation

## 2019-04-13 DIAGNOSIS — Z79899 Other long term (current) drug therapy: Secondary | ICD-10-CM | POA: Insufficient documentation

## 2019-04-13 LAB — CBC
HCT: 46.2 % (ref 39.0–52.0)
Hemoglobin: 16.6 g/dL (ref 13.0–17.0)
MCH: 32 pg (ref 26.0–34.0)
MCHC: 35.9 g/dL (ref 30.0–36.0)
MCV: 89 fL (ref 80.0–100.0)
Platelets: 265 10*3/uL (ref 150–400)
RBC: 5.19 MIL/uL (ref 4.22–5.81)
RDW: 11.4 % — ABNORMAL LOW (ref 11.5–15.5)
WBC: 8.2 10*3/uL (ref 4.0–10.5)
nRBC: 0 % (ref 0.0–0.2)

## 2019-04-13 LAB — BASIC METABOLIC PANEL
Anion gap: 11 (ref 5–15)
BUN: 16 mg/dL (ref 6–20)
CO2: 23 mmol/L (ref 22–32)
Calcium: 9.5 mg/dL (ref 8.9–10.3)
Chloride: 104 mmol/L (ref 98–111)
Creatinine, Ser: 1.31 mg/dL — ABNORMAL HIGH (ref 0.61–1.24)
GFR calc Af Amer: >60 mL/min (ref >60)
GFR calc non Af Amer: >60 mL/min (ref >60)
Glucose, Bld: 192 mg/dL — ABNORMAL HIGH (ref 70–99)
Potassium: 3.6 mmol/L (ref 3.5–5.1)
Sodium: 138 mmol/L (ref 135–145)

## 2019-04-13 LAB — TROPONIN I (HIGH SENSITIVITY): Troponin I (High Sensitivity): 2 ng/L (ref <18)

## 2019-04-13 MED ORDER — BENZONATATE 200 MG PO CAPS: 200.0000 mg | ORAL_CAPSULE | Freq: Three times a day (TID) | ORAL | 0 refills | Status: AC | PRN

## 2019-04-13 MED ORDER — ALBUTEROL SULFATE HFA 108 (90 BASE) MCG/ACT IN AERS: 2.0000 | INHALATION_SPRAY | Freq: Four times a day (QID) | RESPIRATORY_TRACT | 0 refills | Status: AC | PRN

## 2019-04-13 MED ORDER — ALBUTEROL SULFATE HFA 108 (90 BASE) MCG/ACT IN AERS
10.0000 | INHALATION_SPRAY | Freq: Once | RESPIRATORY_TRACT | Status: AC
  Administered 2019-04-13: 10 via RESPIRATORY_TRACT
  Filled 2019-04-13: qty 6.7

## 2019-04-13 NOTE — Discharge Instructions (Addendum)
Follow-up with your regular doctor if not improving in 3 to 4 days.  Return emergency department symptoms are worsening.  Use the inhaler to help you breathe easier.  Mucinex will help decrease the amount of congestion in your lungs.  Tylenol if you have any fever or pain.  Due to your decreased immune status you should watch yourself very carefully and if worsening please return emergency department immediately.

## 2019-04-13 NOTE — ED Provider Notes (Signed)
[facility] Emergency Department Provider Note  ____________________________________________   First MD Initiated Contact with Patient 04/13/19 1605     (approximate)  I have reviewed the triage vital signs and the nursing notes.   HISTORY  Chief Complaint Cough and Possible COVID exposure    HPI Hayden Hicks is a 38 y.o. male presents to the  emergency department with concerns of Covid.  States he has had some chest pain, cough, wheezing and nausea for 1 week.  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  He had a test 1 month ago which was negative.  Patient's HIV positive and on medication for such.  He has not missed any doses of his medication.  He denies any fever or chills.  States his sense of taste comes and goes.  Sense of smell comes and goes.   Past Medical History:  Diagnosis Date   Arthritis    Cryptosporidial gastroenteritis (HCC)    Elevated troponin    HIV infection (HCC)    Hypertension    Pericarditis     Patient Active Problem List   Diagnosis Date Noted   Cigarette smoker 02/12/2018   Cryptosporidial gastroenteritis (HCC)    Pericarditis    Elevated troponin    Annual physical exam 08/23/2011   Polycythemia 02/20/2011   Bad breath 02/20/2011   Flu-like symptoms 02/20/2011   HTN (hypertension) 09/07/2010   Human immunodeficiency virus (HIV) disease (HCC) 08/15/2010   ADJ DISORDER WITH MIXED ANXIETY & DEPRESSED MOOD 08/15/2010   ARTHROSCOPY, KNEE, HX OF 08/15/2010    Past Surgical History:  Procedure Laterality Date   KNEE ARTHROSCOPY      Prior to Admission medications   Medication Sig Start Date End Date Taking? Authorizing Provider  abacavir-dolutegravir-lamiVUDine (TRIUMEQ) 600-50-300 MG tablet Take 1 tablet by mouth daily. 02/17/19  Yes Daiva Eves, Lisette Grinder, MD  albuterol (VENTOLIN HFA) 108 (90 Base)  MCG/ACT inhaler Inhale 2 puffs into the lungs every 6 (six) hours as needed for wheezing or shortness of breath. 04/13/19   Youlanda Tomassetti, Roselyn Bering, PA-C  benzonatate (TESSALON) 200 MG capsule Take 1 capsule (200 mg total) by mouth 3 (three) times daily as needed for cough. 04/13/19   Faythe Ghee, PA-C    Allergies Patient has no known allergies.  History reviewed. No pertinent family history.  Social History Social History   Tobacco Use   Smoking status: Current Some Day Smoker    Types: Cigarettes   Smokeless tobacco: Never Used   Tobacco comment: occ  Substance Use Topics   Alcohol use: No    Comment: social   Drug use: Yes    Types: Marijuana    Review of Systems  Constitutional: No fever/chills Eyes: No visual changes. ENT: No sore throat. Respiratory: Positive cough Cardiovascular: Positive for chest pain when he lies down Genitourinary: Negative for dysuria. Musculoskeletal: Negative for back pain. Skin: Negative for rash.    ____________________________________________   PHYSICAL EXAM:  VITAL SIGNS: ED Triage Vitals  Enc Vitals Group     BP 04/13/19 1350 (!) 169/117     Pulse Rate 04/13/19 1350 (!) 121     Resp 04/13/19 1350 18     Temp 04/13/19 1350 99.8 F (37.7 C)     Temp Source 04/13/19 1350 Oral     SpO2 04/13/19 1350 93 %     Weight 04/13/19 1348  185 lb (83.9 kg)     Height 04/13/19 1348 5\' 9"  (1.753 m)     Head Circumference --      Peak Flow --      Pain Score 04/13/19 1348 7     Pain Loc --      Pain Edu? --      Excl. in GC? --     Constitutional: Alert and oriented. Well appearing and in no acute distress. Eyes: Conjunctivae are normal.  Head: Atraumatic. Nose: No congestion/rhinnorhea. Mouth/Throat: Mucous membranes are moist.   Neck:  supple no lymphadenopathy noted Cardiovascular: Normal rate, regular rhythm. Heart sounds are normal, heart rate is 96 Respiratory: Normal respiratory effort.  No retractions, lungs c t a, pulse  ox is 98% on room air Abd: soft nontender bs normal all 4 quad GU: deferred Musculoskeletal: FROM all extremities, warm and well perfused Neurologic:  Normal speech and language.  Skin:  Skin is warm, dry and intact. No rash noted. Psychiatric: Mood and affect are normal. Speech and behavior are normal.  ____________________________________________   LABS (all labs ordered are listed, but only abnormal results are displayed)  Labs Reviewed  BASIC METABOLIC PANEL - Abnormal; Notable for the following components:      Result Value   Glucose, Bld 192 (*)    Creatinine, Ser 1.31 (*)    All other components within normal limits  CBC - Abnormal; Notable for the following components:   RDW 11.4 (*)    All other components within normal limits  SARS CORONAVIRUS 2 (TAT 6-24 HRS)  TROPONIN I (HIGH SENSITIVITY)  TROPONIN I (HIGH SENSITIVITY)   ____________________________________________   ____________________________________________  RADIOLOGY  Chest x-ray is normal  ____________________________________________   PROCEDURES  Procedure(s) performed: Albuterol 10 puffs   Procedures    ____________________________________________   INITIAL IMPRESSION / ASSESSMENT AND PLAN / ED COURSE  Pertinent labs & imaging results that were available during my care of the patient were reviewed by me and considered in my medical decision making (see chart for details).   Patient is 38 year old male presents emergency department with concerns of Covid.  See HPI  Physical exam shows patient does appear well.  We will do a walking pulse ox due to his HIV status.  Walking pulse ox remained normal.  Patient does appear very well.  Covid test will be done at discharge.  EKG showed sinus tachycardia on arrival, CBC is normal, basic metabolic panel shows elevated glucose 192, troponin is normal, Covid is pending  Discussed all of the results with the patient and his partner.  Explained to  him he needs to watch himself very carefully.  Covid test should result in 6 to 24 hours.  If he is worsening he needs to return to the emergency department.  States he understands will comply.  Is discharged stable condition.    Hayden Hicks was evaluated in Emergency Department on 04/13/2019 for the symptoms described in the history of present illness. He was evaluated in the context of the global COVID-19 pandemic, which necessitated consideration that the patient might be at risk for infection with the SARS-CoV-2 virus that causes COVID-19. Institutional protocols and algorithms that pertain to the evaluation of patients at risk for COVID-19 are in a state of rapid change based on information released by regulatory bodies including the CDC and federal and state organizations. These policies and algorithms were followed during the patient's care in the ED.   As part of my medical  decision making, I reviewed the following data within the [facility] notes reviewed and incorporated, Labs reviewed see above, EKG interpreted tachycardia, Old chart reviewed, Radiograph reviewed chest x-ray is normal, Notes from prior ED visits and  Controlled Substance Database  ____________________________________________   FINAL CLINICAL IMPRESSION(S) / ED DIAGNOSES  Final diagnoses:  Suspected COVID-19 virus infection      NEW MEDICATIONS STARTED DURING THIS VISIT:  New Prescriptions   ALBUTEROL (VENTOLIN HFA) 108 (90 BASE) MCG/ACT INHALER    Inhale 2 puffs into the lungs every 6 (six) hours as needed for wheezing or shortness of breath.   BENZONATATE (TESSALON) 200 MG CAPSULE    Take 1 capsule (200 mg total) by mouth 3 (three) times daily as needed for cough.     Note:  This document was prepared using Dragon voice recognition software and may include unintentional dictation errors.    Versie Starks, PA-C 04/13/19 1636    Lavonia Drafts, MD 04/13/19 1640

## 2019-04-13 NOTE — ED Triage Notes (Addendum)
Pt arrived via POV with reports of diarrhea, pain with cough, wheezing, shortness of breath, headaches, nausea.  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  Pt had test over 1 month ago states he was negative.  Pt is also HIV positive, on medication, no missed doses

## 2019-04-13 NOTE — ED Notes (Signed)
Ambualtory O2 sat 94%

## 2019-04-14 LAB — SARS CORONAVIRUS 2 (TAT 6-24 HRS): SARS Coronavirus 2: NEGATIVE

## 2019-06-02 ENCOUNTER — Ambulatory Visit: Payer: Self-pay

## 2019-06-05 ENCOUNTER — Ambulatory Visit: Payer: Self-pay | Attending: Internal Medicine

## 2019-06-05 DIAGNOSIS — Z20822 Contact with and (suspected) exposure to covid-19: Secondary | ICD-10-CM | POA: Insufficient documentation

## 2019-06-07 LAB — NOVEL CORONAVIRUS, NAA: SARS-CoV-2, NAA: NOT DETECTED

## 2019-06-17 ENCOUNTER — Ambulatory Visit: Payer: Self-pay

## 2019-06-17 ENCOUNTER — Other Ambulatory Visit: Payer: Self-pay

## 2019-09-16 ENCOUNTER — Ambulatory Visit: Payer: Self-pay

## 2019-09-16 ENCOUNTER — Other Ambulatory Visit: Payer: Self-pay

## 2019-09-17 ENCOUNTER — Telehealth: Payer: Self-pay | Admitting: Pharmacy Technician

## 2019-09-17 NOTE — Telephone Encounter (Signed)
[facility] Patient Advocate Encounter   Was successful in obtaining a Viiv patient assistance for Triumeq.  This assistance will bridge patient until ADAP approved.  I have spoken with the patient.    The billing information is as follows and has been shared with [employer].  ID [id number] BIN [id number] PCN [id number] GRP [id number]  Kathie Rhodes E. Dimas Aguas CPhT [facility] Patient [facility] Phone: [phone] Fax:  [phone]

## 2019-09-18 ENCOUNTER — Ambulatory Visit: Payer: Self-pay | Attending: Internal Medicine

## 2019-09-18 ENCOUNTER — Encounter: Payer: Self-pay | Admitting: Infectious Disease

## 2019-09-18 DIAGNOSIS — Z23 Encounter for immunization: Secondary | ICD-10-CM

## 2019-09-18 NOTE — Progress Notes (Signed)
   Covid-19 Vaccination Clinic  Name:  AMAAN MEYER    MRN: [id number] DOB: [birth year]  09/18/2019  Mr. Rotert was observed post Covid-19 immunization for 15 minutes without incident. He was provided with Vaccine Information Sheet and instruction to access the V-Safe system.   Mr. Stiehl was instructed to call 911 with any severe reactions post vaccine: Marland Kitchen Difficulty breathing  . Swelling of face and throat  . A fast heartbeat  . A bad rash all over body  . Dizziness and weakness   Immunizations Administered    Name Date Dose VIS Date Route   Pfizer COVID-19 Vaccine 09/18/2019  2:46 PM 0.3 mL 07/23/2018 Intramuscular   Manufacturer: [employer], [employer]   Lot: [id number]   NDC: [address]-1

## 2019-09-30 ENCOUNTER — Telehealth: Payer: Self-pay | Admitting: *Deleted

## 2019-09-30 NOTE — Telephone Encounter (Signed)
Patient hurt his foot, was seen at outside emergency room.  He called to schedule follow up here for this foot pain. RN encouraged patient to reach out to establish Primary care for this, sent mychart message with PCP information. Andree Coss, RN

## 2019-10-01 ENCOUNTER — Encounter: Payer: Self-pay | Admitting: Internal Medicine

## 2019-10-09 ENCOUNTER — Ambulatory Visit: Payer: Self-pay | Attending: Internal Medicine

## 2019-10-09 DIAGNOSIS — Z23 Encounter for immunization: Secondary | ICD-10-CM

## 2019-10-09 NOTE — Progress Notes (Signed)
   Covid-19 Vaccination Clinic  Name:  YASHAR INCLAN    MRN: [id number] DOB: 1981-05-09  10/09/2019  Mr. Dumlao was observed post Covid-19 immunization for 15 minutes without incident. He was provided with Vaccine Information Sheet and instruction to access the V-Safe system.   Mr. Tupy was instructed to call 911 with any severe reactions post vaccine: Marland Kitchen Difficulty breathing  . Swelling of face and throat  . A fast heartbeat  . A bad rash all over body  . Dizziness and weakness   Immunizations Administered    Name Date Dose VIS Date Route   Pfizer COVID-19 Vaccine 10/09/2019 12:31 PM 0.3 mL 07/23/2018 Intramuscular   Manufacturer: [employer], [employer]   Lot: [id number]   NDC: [address]-5

## 2019-10-13 ENCOUNTER — Ambulatory Visit: Payer: Self-pay

## 2019-10-16 ENCOUNTER — Ambulatory Visit: Payer: Self-pay | Admitting: Infectious Disease

## 2019-10-28 ENCOUNTER — Ambulatory Visit: Payer: Self-pay | Admitting: Internal Medicine

## 2019-10-28 ENCOUNTER — Other Ambulatory Visit: Payer: Self-pay

## 2019-10-28 ENCOUNTER — Encounter: Payer: Self-pay | Admitting: Internal Medicine

## 2019-10-28 VITALS — BP 146/97 | HR 104 | Temp 98.6°F | Wt 209.8 lb

## 2019-10-28 DIAGNOSIS — M722 Plantar fascial fibromatosis: Secondary | ICD-10-CM | POA: Insufficient documentation

## 2019-10-28 MED ORDER — MELOXICAM 7.5 MG PO TABS: 7.5000 mg | ORAL_TABLET | Freq: Every day | ORAL | 0 refills | Status: DC

## 2019-10-28 NOTE — Patient Instructions (Addendum)
Thank you for visiting [place] in clinic today.  Below is a summary of what we discussed:  1.  Foot pain -I think the pain you are experiencing in your feet is due to a condition called plantars fasciitis. This is essentially inflammation of the tendons below your feet. -Take Mobic 7.5 mg daily for 14 days. Take this medication with food. -Continue taking Tylenol 650 mg every 6 hours as needed throughout the day -Ice your foot for 20 minutes at the end of the day and have it elevated. This will decrease the inflammation in your feet -Go to YouTube and look up "plantars fasciitis exercises".  The stretches and exercises will help with the pain in your foot.  2.  Follow-up -Please schedule a follow-up appointment to see [place] in 2 weeks to see if you have made progress  Any questions or concerns in the meantime, please feel free to reach out to [place].

## 2019-10-28 NOTE — Progress Notes (Signed)
   CC: Bilateral foot pain  HPI:  [name] is a 39 y.o. with a history as noted below who presents with bilateral foot pain.  Please refer to the problem based charting for details.   Past Medical History:  Diagnosis Date  . Arthritis   . Cryptosporidial gastroenteritis (HCC)   . Elevated troponin   . HIV infection (HCC)   . Hypertension   . Pericarditis    Review of Systems: All systems were reviewed and are otherwise negative unless mentioned in the HPI  Physical Exam:  Vitals:   10/28/19 1416  BP: (!) 146/97  Pulse: (!) 104  Temp: 98.6 F (37 C)  TempSrc: Oral  SpO2: 99%  Weight: 209 lb 12.8 oz (95.2 kg)   Physical Exam Constitutional:      General: He is not in acute distress.    Appearance: Normal appearance. He is not ill-appearing, toxic-appearing or diaphoretic.  HENT:     Head: Normocephalic and atraumatic.  Pulmonary:     Effort: Pulmonary effort is normal.  Musculoskeletal:        General: No swelling, deformity or signs of injury.     Right lower leg: No edema.     Left lower leg: No edema.     Right foot: No deformity or foot drop.     Left foot: No deformity or foot drop.       Feet:  Feet:     Right foot:     Skin integrity: Skin integrity normal.     Toenail Condition: Right toenails are normal.     Left foot:     Skin integrity: Skin integrity normal.     Toenail Condition: Left toenails are normal.  Neurological:     General: No focal deficit present.     Mental Status: He is alert.     Sensory: No sensory deficit.     Motor: No weakness.    Assessment & Plan:   See Encounters Tab for problem based charting.  Patient discussed with Dr. Rogelia Boga

## 2019-10-28 NOTE — Assessment & Plan Note (Signed)
Pt states that he has been experiencing bilateral foot pain for the past month.  He states that it is worsened by physical activity and prolonged standing.  His pain seems to be worse at the end of the day.  He states the pain is 10/10 at its worst, and he does not experience pain when it is at its best. He has gone to the ER twice at an outside hospital where he was told it may be more neuropathic pain and was prescribed gabapentin.  He is taken the gabapentin 300 mg 3 times daily with no relief.    The patient is frequently on his feet and physically exerting himself at work.  Over the past month, his job is required him to wear steel toed shoes.  He says the shoes were uncomfortable, so he put Styrofoam in his shoes to try to make him more comfortable.  He says ibuprofen and Tylenol as needed do seem to help.  On exam, the patient is exquisitely tender on bilateral heels, consistent with the anatomic position of the plantar fascia.  Given his occupation, location of his pain, and response to NSAIDs and Tylenol, I believe his pain is likely not neuropathic in nature and more consistent with plantars fasciitis  -Encourage patient to look up plantars she had his exercises/stretches on YouTube -Encouraged patient to purchase a firm ball to roll under his feet -Encourage patient to ice his foot at the end of the day after work -Prescribed Mobic 7.5 mg daily for 14 days.  If patient does not improve we will consider shoe inserts and potential sports medicine referral. -Continue Tylenol as needed -Follow-up in 2 weeks

## 2019-10-30 ENCOUNTER — Ambulatory Visit (INDEPENDENT_AMBULATORY_CARE_PROVIDER_SITE_OTHER): Payer: Self-pay | Admitting: Infectious Disease

## 2019-10-30 ENCOUNTER — Other Ambulatory Visit: Payer: Self-pay

## 2019-10-30 ENCOUNTER — Encounter: Payer: Self-pay | Admitting: Infectious Disease

## 2019-10-30 VITALS — BP 150/82 | HR 80 | Temp 98.0°F | Ht 68.0 in | Wt 208.0 lb

## 2019-10-30 DIAGNOSIS — I10 Essential (primary) hypertension: Secondary | ICD-10-CM

## 2019-10-30 DIAGNOSIS — M722 Plantar fascial fibromatosis: Secondary | ICD-10-CM

## 2019-10-30 DIAGNOSIS — B20 Human immunodeficiency virus [HIV] disease: Secondary | ICD-10-CM

## 2019-10-30 DIAGNOSIS — R6889 Other general symptoms and signs: Secondary | ICD-10-CM

## 2019-10-30 MED ORDER — DOVATO 50-300 MG PO TABS: 1.0000 | ORAL_TABLET | Freq: Every day | ORAL | 11 refills | Status: DC

## 2019-10-30 NOTE — Patient Instructions (Signed)
Financial appt in July  Labs today  Switch to Dovato   Labs in 2 months and then E visit (VIDEO) with me in 10 weeks

## 2019-10-30 NOTE — Progress Notes (Signed)
Subjective:   Chief complaint: followup for HIV disease   Patient ID: Hayden Hicks, male    DOB: [birth year], 39 y.o.   MRN: [id number]  HPI  Hayden Hicks is a 39 y.o. male who had been doing superbly well on his  antiviral regimen, [facility]  with undetectable viral load and health cd4 count..    Been separate from plantar fasciitis and been seen in internal medicine clinic for this.  I would like to get rid of the abacavir in his regimen and switch him to [facility] and he is agreeable to that.       Past Medical History:  Diagnosis Date  . Arthritis   . 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 Elevated troponin   . HIV infection ([place])   . Hypertension   . Pericarditis     Past Surgical History:  Procedure Laterality Date  . KNEE ARTHROSCOPY      No family history on file.    Social History   Socioeconomic History  . Marital status: Single    Spouse name: Not on file  . Number of children: Not on file  . Years of education: Not on file  . Highest education level: Not on file  Occupational History  . Not on file  Tobacco Use  . Smoking status: Former Smoker    Types: Cigarettes    Quit date: 10/27/2017    Years since quitting: 2.0  . Smokeless tobacco: Never Used  . Tobacco comment: occ  Substance and Sexual Activity  . Alcohol use: No    Comment: social  . Drug use: Yes    Types: Marijuana  . Sexual activity: Yes    Partners: Male  Other Topics Concern  . Not on file  Social History Narrative  . Not on file   Social Determinants of Health   Financial Resource Strain:   . Difficulty of Paying Living Expenses:   Food Insecurity:   . Worried About Charity fundraiser in the Last Year:   . Arboriculturist in the Last Year:   Transportation Needs:   . Film/video editor (Medical):   Marland Kitchen Lack of Transportation (Non-Medical):   Physical Activity:   . Days of Exercise per Week:   . Minutes of Exercise per Session:    Stress:   . Feeling of Stress :   Social Connections:   . Frequency of Communication with Friends and Family:   . Frequency of Social Gatherings with Friends and Family:   . Attends Religious Services:   . Active Member of Clubs or Organizations:   . Attends Archivist Meetings:   Marland Kitchen Marital Status:     No Known Allergies   Current Outpatient Medications:  .  abacavir-dolutegravir-lamiVUDine (TRIUMEQ) 600-50-300 MG tablet, Take 1 tablet by mouth daily., Disp: 30 tablet, Rfl: 11 .  albuterol (VENTOLIN HFA) 108 (90 Base) MCG/ACT inhaler, Inhale 2 puffs into the lungs every 6 (six) hours as needed for wheezing or shortness of breath., Disp: 16 g, Rfl: 0 .  benzonatate (TESSALON) 200 MG capsule, Take 1 capsule (200 mg total) by mouth 3 (three) times daily as needed for cough., Disp: 30 capsule, Rfl: 0 .  meloxicam (MOBIC) 7.5 MG tablet, Take 1 tablet (7.5 mg total) by mouth daily for 14 days., Disp: 14 tablet, Rfl: 0     Review of Systems  Constitutional: Negative for activity change, appetite change, chills, diaphoresis, fever and  unexpected weight change.  HENT: Negative for congestion, sinus pressure, sneezing, sore throat and trouble swallowing.   Eyes: Negative for photophobia and visual disturbance.  Respiratory: Negative for cough, chest tightness, shortness of breath, wheezing and stridor.   Cardiovascular: Negative for chest pain, palpitations and leg swelling.  Gastrointestinal: Negative for abdominal distention, abdominal pain, anal bleeding, constipation, nausea and vomiting.  Genitourinary: Negative for difficulty urinating, dysuria, flank pain and hematuria.  Musculoskeletal: Negative for arthralgias, back pain, gait problem, joint swelling and myalgias.  Skin: Negative for color change, pallor, rash and wound.  Neurological: Negative for tremors and weakness.  Hematological: Negative for adenopathy. Does not bruise/bleed easily.  Psychiatric/Behavioral:  Negative for agitation, behavioral problems, confusion, decreased concentration, dysphoric mood and sleep disturbance.       Objective:   Physical Exam  Constitutional: He is oriented to person, place, and time. He appears well-developed and well-nourished. No distress.  HENT:  Head: Normocephalic and atraumatic.  Mouth/Throat: Oropharynx is clear and moist. No oropharyngeal exudate.  Eyes: Pupils are equal, round, and reactive to light. Conjunctivae and EOM are normal. No scleral icterus.  Neck: No JVD present. No thyromegaly present.  Cardiovascular: Normal rate and regular rhythm.  Pulmonary/Chest: Effort normal and breath sounds normal. No respiratory distress. He has no wheezes.  Abdominal: Soft. He exhibits no distension.  Musculoskeletal:        General: No tenderness or edema.     Cervical back: Normal range of motion and neck supple.  Lymphadenopathy:    He has no cervical adenopathy.  Neurological: He is alert and oriented to person, place, and time. He exhibits normal muscle tone. Coordination normal.  Skin: Skin is warm and dry. He is not diaphoretic. No erythema. No pallor.  Psychiatric: He has a normal mood and affect. His behavior is normal. Judgment and thought content normal.  Fatigued appearing  Nursing note and vitals reviewed.         Assessment & Plan:  HIV:  Ensure that he is renewed his HMA P in July.  I have made appointment for him when he comes back from vacation I will switch him to [facility] today and he is to have labs in 2 months time and see me in roughly 10 weeks time via video.   HTN: Follow with internal medicine  Anxiety: Seems better controlled  Plantar fasciitis seeing internal medicine may need some referrals to podiatry if not improving

## 2019-10-30 NOTE — Progress Notes (Deleted)
Internal Medicine Clinic Attending  Case discussed with Dr. Renne at the time of the visit.  We reviewed the resident's history and exam and pertinent patient test results.  I agree with the assessment, diagnosis, and plan of care documented in the resident's note.  

## 2019-10-30 NOTE — Progress Notes (Signed)
Internal Medicine Clinic Attending  Case discussed with Dr. Wiegel at the time of the visit.  We reviewed the resident's history and exam and pertinent patient test results.  I agree with the assessment, diagnosis, and plan of care documented in the resident's note.  

## 2019-10-31 LAB — T-HELPER CELL (CD4) - (RCID CLINIC ONLY)
CD4 % Helper T Cell: 26 % — ABNORMAL LOW (ref 33–65)
CD4 T Cell Abs: 527 /uL (ref 400–1790)

## 2019-11-01 LAB — COMPLETE METABOLIC PANEL WITH GFR
AG Ratio: 1.8 (calc) (ref 1.0–2.5)
ALT: 23 U/L (ref 9–46)
AST: 18 U/L (ref 10–40)
Albumin: 4.4 g/dL (ref 3.6–5.1)
Alkaline phosphatase (APISO): 54 U/L (ref 36–130)
BUN: 12 mg/dL (ref 7–25)
CO2: 29 mmol/L (ref 20–32)
Calcium: 9.4 mg/dL (ref 8.6–10.3)
Chloride: 103 mmol/L (ref 98–110)
Creat: 1.02 mg/dL (ref 0.60–1.35)
GFR, Est African American: 107 mL/min/{1.73_m2} (ref >=60)
GFR, Est Non African American: 92 mL/min/{1.73_m2} (ref >=60)
Globulin: 2.5 g/dL (calc) (ref 1.9–3.7)
Glucose, Bld: 122 mg/dL — ABNORMAL HIGH (ref 65–99)
Potassium: 4.4 mmol/L (ref 3.5–5.3)
Sodium: 139 mmol/L (ref 135–146)
Total Bilirubin: 0.4 mg/dL (ref 0.2–1.2)
Total Protein: 6.9 g/dL (ref 6.1–8.1)

## 2019-11-01 LAB — CBC WITH DIFFERENTIAL/PLATELET
Absolute Monocytes: 392 cells/uL (ref 200–950)
Basophils Absolute: 42 cells/uL (ref 0–200)
Basophils Relative: 0.8 %
Eosinophils Absolute: 80 cells/uL (ref 15–500)
Eosinophils Relative: 1.5 %
HCT: 45.6 % (ref 38.5–50.0)
Hemoglobin: 15.9 g/dL (ref 13.2–17.1)
Lymphs Abs: 2104 cells/uL (ref 850–3900)
MCH: 32.6 pg (ref 27.0–33.0)
MCHC: 34.9 g/dL (ref 32.0–36.0)
MCV: 93.6 fL (ref 80.0–100.0)
MPV: 9.2 fL (ref 7.5–12.5)
Monocytes Relative: 7.4 %
Neutro Abs: 2682 cells/uL (ref 1500–7800)
Neutrophils Relative %: 50.6 %
Platelets: 253 10*3/uL (ref 140–400)
RBC: 4.87 10*6/uL (ref 4.20–5.80)
RDW: 12.3 % (ref 11.0–15.0)
Total Lymphocyte: 39.7 %
WBC: 5.3 10*3/uL (ref 3.8–10.8)

## 2019-11-01 LAB — HIV-1 RNA QUANT-NO REFLEX-BLD
HIV 1 RNA Quant: <20 copies/mL — AB
HIV-1 RNA Quant, Log: <1.3 Log copies/mL — AB

## 2019-11-01 LAB — RPR: RPR Ser Ql: NONREACTIVE

## 2019-11-04 LAB — URINE CYTOLOGY ANCILLARY ONLY
Chlamydia: NEGATIVE
Comment: NEGATIVE
Comment: NORMAL
Neisseria Gonorrhea: NEGATIVE

## 2019-11-10 ENCOUNTER — Other Ambulatory Visit: Payer: Self-pay | Admitting: Internal Medicine

## 2019-11-10 DIAGNOSIS — M722 Plantar fascial fibromatosis: Secondary | ICD-10-CM

## 2019-11-18 ENCOUNTER — Encounter: Payer: Self-pay | Admitting: Internal Medicine

## 2019-11-18 ENCOUNTER — Ambulatory Visit: Payer: Self-pay | Admitting: Internal Medicine

## 2019-11-18 ENCOUNTER — Other Ambulatory Visit: Payer: Self-pay

## 2019-11-18 VITALS — BP 151/94 | HR 93 | Temp 98.6°F | Ht 68.0 in | Wt 210.9 lb

## 2019-11-18 DIAGNOSIS — M722 Plantar fascial fibromatosis: Secondary | ICD-10-CM

## 2019-11-18 DIAGNOSIS — I10 Essential (primary) hypertension: Secondary | ICD-10-CM

## 2019-11-18 MED ORDER — DICLOFENAC SODIUM 50 MG PO TBEC: 50.0000 mg | DELAYED_RELEASE_TABLET | Freq: Three times a day (TID) | ORAL | 0 refills | Status: DC | PRN

## 2019-11-18 MED ORDER — DICLOFENAC SODIUM 1 % EX GEL: 4.0000 g | Freq: Four times a day (QID) | CUTANEOUS | 2 refills | Status: AC

## 2019-11-18 NOTE — Assessment & Plan Note (Signed)
Pt no currently on antihypertensives and BP was elevated to 151/94 today. We will continue to monitor this especially with new prescriptions of NSAIDs.  Plan: -F/u at next visit -Consider antihypertensives at next visit if BP continues to be elevated

## 2019-11-18 NOTE — Progress Notes (Signed)
   CC: Foot pain follow up   HPI: Mr.Kendricks D Sek is a 39 y.o. male with a hx as noted below who presents for foot pain follow up. Please refer to the problem based charting for further details.   Past Medical History:  Diagnosis Date  . Arthritis   . Cryptosporidial gastroenteritis ([facility])   . Elevated troponin   . HIV infection ([facility])   . Hypertension   . Pericarditis    Review of Systems:  Negative unless mentioned in the HPI  Physical Exam: Vitals:   11/18/19 0833  BP: (!) 151/94  Pulse: 93  Temp: 98.6 F (37 C)  TempSrc: Oral  SpO2: 99%  Weight: 210 lb 14.4 oz (95.7 kg)  Height: 5\' 8"  (1.727 m)    Physical Exam Musculoskeletal:     Right foot: Tenderness present. No swelling or deformity.     Left foot: Tenderness present. No swelling or deformity.       Feet:  Feet:     Right foot:     Skin integrity: No ulcer, blister or erythema.     Left foot:     Skin integrity: No ulcer, blister or erythema.    Assessment & Plan:   See Encounters Tab for problem based charting.  Patient discussed with Dr. 

## 2019-11-18 NOTE — Patient Instructions (Signed)
Thank you for visiting [place] in clinic today.  Below is a summary of what we discussed:  1.  Plantars fasciitis -I prescribed Voltaren gel and oral diclofenac for you to take to manage your pain.  Continue to stretch and use a hard ball to stretch that tissue. -You may also freeze a water bottle and roll your foot on top of that, as this will apply cold and stretching at the same time.  2.  Follow-up -If you do not notice a big difference within the next couple weeks, schedule another appointment so we can discuss further options.  If you have any questions or concerns in the meantime, please feel free to reach out to [place].

## 2019-11-18 NOTE — Progress Notes (Signed)
Internal Medicine Clinic Attending  Case discussed with Dr. Crossland at the time of the visit.  We reviewed the resident's history and exam and pertinent patient test results.  I agree with the assessment, diagnosis, and plan of care documented in the resident's note.  

## 2019-11-18 NOTE — Assessment & Plan Note (Addendum)
The patient states he continues to have bilateral foot pain on the plantar aspect of his feet.  Continues to be worsened with physical activity and prolonged standing.  His pain is 7/10 today.  Patient states that he is tried as needed Mobic, YouTube plantars fasciitis exercises, stretching and icing without significant relief  Patient states he does not wear steel toed shoes at work, and wears gym shoes now.  The inserts of his shoes are very worn down.  On exam the patient has tenderness to palpation in the anatomic position of the plantar fascia bilaterally.  Plan: -Oral diclofenac 50 mg 3 times daily as needed -Voltaren gel as needed -Patient advised to purchase Dr. Margart Sickles shoe inserts over-the-counter for his shoes -Patient would likely benefit from 1 card given his uninsured status.  Patient has appointment on 6/28 with a financial counselor to discuss -Follow-up in 4 weeks

## 2019-11-19 LAB — BMP8+ANION GAP
Anion Gap: 15 mmol/L (ref 10.0–18.0)
BUN/Creatinine Ratio: 10 (ref 9–20)
BUN: 11 mg/dL (ref 6–20)
CO2: 22 mmol/L (ref 20–29)
Calcium: 9.6 mg/dL (ref 8.7–10.2)
Chloride: 101 mmol/L (ref 96–106)
Creatinine, Ser: 1.07 mg/dL (ref 0.76–1.27)
GFR calc Af Amer: 101 mL/min/{1.73_m2} (ref >59)
GFR calc non Af Amer: 87 mL/min/{1.73_m2} (ref >59)
Glucose: 99 mg/dL (ref 65–99)
Potassium: 4.2 mmol/L (ref 3.5–5.2)
Sodium: 138 mmol/L (ref 134–144)

## 2019-11-23 ENCOUNTER — Other Ambulatory Visit: Payer: Self-pay | Admitting: Internal Medicine

## 2019-11-23 DIAGNOSIS — M722 Plantar fascial fibromatosis: Secondary | ICD-10-CM

## 2019-11-24 ENCOUNTER — Ambulatory Visit: Payer: Self-pay

## 2019-12-10 ENCOUNTER — Other Ambulatory Visit: Payer: Self-pay | Admitting: Internal Medicine

## 2019-12-10 DIAGNOSIS — M722 Plantar fascial fibromatosis: Secondary | ICD-10-CM

## 2019-12-15 ENCOUNTER — Ambulatory Visit: Payer: Self-pay

## 2019-12-24 ENCOUNTER — Other Ambulatory Visit: Payer: Self-pay

## 2020-01-07 ENCOUNTER — Encounter: Payer: Self-pay | Admitting: Infectious Disease

## 2020-01-14 ENCOUNTER — Encounter: Payer: Self-pay | Admitting: Internal Medicine

## 2020-01-14 ENCOUNTER — Ambulatory Visit (HOSPITAL_COMMUNITY)
Admission: RE | Admit: 2020-01-14 | Discharge: 2020-01-14 | Disposition: A | Discharge status: Home / Self Care | Payer: Self-pay | Source: Ambulatory Visit | Attending: Student in an Organized Health Care Education/Training Program | Admitting: Student in an Organized Health Care Education/Training Program

## 2020-01-14 ENCOUNTER — Ambulatory Visit (INDEPENDENT_AMBULATORY_CARE_PROVIDER_SITE_OTHER): Payer: Self-pay | Admitting: Internal Medicine

## 2020-01-14 VITALS — BP 147/109 | HR 96 | Temp 98.6°F | Wt 207.7 lb

## 2020-01-14 DIAGNOSIS — R0789 Other chest pain: Secondary | ICD-10-CM | POA: Insufficient documentation

## 2020-01-14 DIAGNOSIS — I10 Essential (primary) hypertension: Secondary | ICD-10-CM

## 2020-01-14 DIAGNOSIS — I309 Acute pericarditis, unspecified: Secondary | ICD-10-CM

## 2020-01-14 MED ORDER — COLCHICINE 0.6 MG PO TABS: 0.6000 mg | ORAL_TABLET | Freq: Every day | ORAL | 2 refills | Status: DC

## 2020-01-14 MED ORDER — IBUPROFEN 600 MG PO TABS: 600.0000 mg | ORAL_TABLET | Freq: Three times a day (TID) | ORAL | 0 refills | Status: AC

## 2020-01-14 MED ORDER — AMLODIPINE BESYLATE 10 MG PO TABS: 5.0000 mg | ORAL_TABLET | Freq: Every day | ORAL | 3 refills | Status: DC

## 2020-01-14 MED FILL — IBUPROFEN 600 MG TABLET: 600 | 7 days supply | Qty: 21 | Fill #0

## 2020-01-14 MED FILL — AMLODIPINE BESYLATE 10 MG T: 10 | 30 days supply | Qty: 15 | Fill #0

## 2020-01-14 MED FILL — COLCHICINE 0.6 MG TABS: 0.6 | 30 days supply | Qty: 30 | Fill #0

## 2020-01-14 NOTE — Assessment & Plan Note (Signed)
BP today was again elevated at 166/109>rechecked:147/109. He is not on any antihypertensive medication. His last blood pressure were: BP Readings from Last 3 Encounters:  01/14/20 (!) 147/109  11/18/19 (!) 151/94  10/30/19 (!) 150/82    -Starting Amlodipine 10 mg QD -F/u in clinic in 2 week.

## 2020-01-14 NOTE — Patient Instructions (Signed)
Thank you for allowing [place] to provide your care today. Today we discussed your this tightness. Your EKG, was unremarkable and did not show any evidence of acute heart attack.   Symptoms, may be due to mild pericarditis, which is an inflammation around your heart.  I sent you prescription for ibuprofen and colchicine for that.  Please take them as instructed. Please follow-up in clinic in 2 weeks or sooner if your symptoms got worse. As always, if having severe symptoms such as severe chest pain, shortness of breath, heart racing or other concerning symptoms, please seek medical attention at emergency room.  I also start a blood pressure sent for you.  Please take 1 tablet of amlodipine every day and follow-up in clinic in 2 weeks. Should you have any questions or concerns please call the internal medicine clinic at [phone].    Thank you!

## 2020-01-14 NOTE — Assessment & Plan Note (Addendum)
Clinically diagnosed.  Patient presented to [facility] today with intermittent chest pain/tightness that started about one and a half week ago. The pain is in the middle of his chest. Does not radiate to his arms or back or jaw. It occurs 1-2 times a day and mostly occurs when he lays down. The pain can last for few minutes and sometimes is 7/10 in intensity. He denies any chest pain with activity.  He does not recall any aggravating or releiving factors. His pain is not associated with eating and denies symptoms of reflux.  No SOB. No orthopnea, no fever or chills or recent illness. He received COVID-19 while agao (on march and April)  EKG today> unremarkable, with no ST-T changes.I am less concerned about acute coronary syndrem based on his presentation, no chest pain on exersion, normal EKG. He had a negative stress test on 2015.  BP checked on both upper extremity and no significant difference to suggest Aortic dissection.   Positional chest pain can be suggestive of mild pericarditis. Will try Colchicine and NSAID trial and reevaluate him in 2 weeks.   -Colchicine 06 mg BID (provided supply for 1 months with 2 refills on it. Given the mild presentation, one month treatment may be sufficient.) -Ibuprophen 600 mg TID for a week than PRN -f/u in clinic in 2 weeks to assess the response. or sooner if no improvement -Strict ER precautions reviewed with patient though. -Patient is not insured. Will arrange an appointment to meet with our financial counselor for orange card.  Note for work provided.

## 2020-01-14 NOTE — Progress Notes (Signed)
    CC: Chest pain  HPI:  [name] is a 39 y.o. male with PMHx as documented below, presented with chest tightness. Please refer to problem based charting for further details and assessment and plan of current problem and chronic medical conditions.   Medication: Dovato  Past Medical History:  Diagnosis Date  . Arthritis   . Cryptosporidial gastroenteritis ([facility])   . Elevated troponin   . HIV infection ([facility])   . Hypertension   . Pericarditis    Review of Systems:  Review of Systems  Constitutional: Negative for chills and fever.  Respiratory: Negative for shortness of breath.   Cardiovascular: Positive for chest pain. Negative for orthopnea and leg swelling.    Physical Exam:  Vitals:   01/14/20 1028 01/14/20 1054  BP: (!) 166/109 (!) 147/109  Pulse: 93 96  Temp: 98.6 F (37 C)   TempSrc: Oral   SpO2: 98%   Weight: 207 lb 11.2 oz (94.2 kg)    Constitutional: Well-developed and well-nourished. No acute distress.  HENT:  Head: Normocephalic and atraumatic.  Eyes: Conjunctivae are normal, EOM nl Cardiovascular:  RRR, nl S1S2, no murmur,  no LEE Respiratory: Effort normal and breath sounds normal. No respiratory distress. No wheezes.  GI: Soft. There is no tenderness.  Neurological: Is alert and oriented x 3  Skin: Not diaphoretic. No erythema.  Psychiatric: Slightly anxious. Behavior is normal. Judgment and thought content normal.    Assessment & Plan:   See Encounters Tab for problem based charting.  Patient discussed with Dr. Oswaldo Done

## 2020-01-15 NOTE — Progress Notes (Signed)
Internal Medicine Clinic Attending  Case discussed with Dr. Masoudi  At the time of the visit.  We reviewed the resident's history and exam and pertinent patient test results.  I agree with the assessment, diagnosis, and plan of care documented in the resident's note.  

## 2020-01-29 ENCOUNTER — Encounter: Payer: Self-pay | Admitting: Infectious Disease

## 2020-01-29 ENCOUNTER — Telehealth (INDEPENDENT_AMBULATORY_CARE_PROVIDER_SITE_OTHER): Payer: Self-pay | Admitting: Infectious Disease

## 2020-01-29 ENCOUNTER — Other Ambulatory Visit: Payer: Self-pay

## 2020-01-29 ENCOUNTER — Ambulatory Visit: Payer: Self-pay

## 2020-01-29 DIAGNOSIS — B20 Human immunodeficiency virus [HIV] disease: Secondary | ICD-10-CM

## 2020-01-29 NOTE — Progress Notes (Signed)
Virtual Visit via Telephone Note  I connected with Hayden Hicks on 01/29/20 at  9:30 AM EDT by telephone and verified that I am speaking with the correct person using two identifiers.  Location: Patient: Home Provider: My home   I discussed the limitations, risks, security and privacy concerns of performing an evaluation and management service by telephone and the availability of in person appointments. I also discussed with the patient that there may be a patient responsible charge related to this service. The patient expressed understanding and agreed to proceed.   History of Present Illness:  male who had been doing superbly well on his  antiviral regimen, switched from [facility] to  [facility]  with undetectable viral load and health cd4 count.  I had tried to switch him to DOVATO but he has only done this in the past month  He needs labs done which I have scheduled for next week and return visit to see me later in September.  Past Medical History:  Diagnosis Date  . Arthritis   . Cryptosporidial gastroenteritis ([facility])   . Elevated troponin   . HIV infection ([facility])   . Hypertension   . Pericarditis     Past Surgical History:  Procedure Laterality Date  . KNEE ARTHROSCOPY      No family history on file.    Social History   Socioeconomic History  . Marital status: Single    Spouse name: Not on file  . Number of children: Not on file  . Years of education: Not on file  . Highest education level: Not on file  Occupational History  . Not on file  Tobacco Use  . Smoking status: Former Smoker    Types: Cigarettes    Quit date: 10/27/2017    Years since quitting: 2.2  . Smokeless tobacco: Never Used  . Tobacco comment: occ  Vaping Use  . Vaping Use: Some days  Substance and Sexual Activity  . Alcohol use: No    Comment: social  . Drug use: Yes    Types: Marijuana  . Sexual activity: Yes    Partners: Male  Other Topics Concern  . Not on file  Social History  Narrative  . Not on file   Social Determinants of Health   Financial Resource Strain:   . Difficulty of Paying Living Expenses: Not on file  Food Insecurity:   . Worried About Programme researcher, broadcasting/film/video in the Last Year: Not on file  . Ran Out of Food in the Last Year: Not on file  Transportation Needs:   . Lack of Transportation (Medical): Not on file  . Lack of Transportation (Non-Medical): Not on file  Physical Activity:   . Days of Exercise per Week: Not on file  . Minutes of Exercise per Session: Not on file  Stress:   . Feeling of Stress : Not on file  Social Connections:   . Frequency of Communication with Friends and Family: Not on file  . Frequency of Social Gatherings with Friends and Family: Not on file  . Attends Religious Services: Not on file  . Active Member of Clubs or Organizations: Not on file  . Attends Banker Meetings: Not on file  . Marital Status: Not on file    No Known Allergies   Current Outpatient Medications:  .  albuterol (VENTOLIN HFA) 108 (90 Base) MCG/ACT inhaler, Inhale 2 puffs into the lungs every 6 (six) hours as needed for wheezing or shortness of breath.,  Disp: 16 g, Rfl: 0 .  amLODipine (NORVASC) 10 MG tablet, Take 0.5 tablets (5 mg total) by mouth daily., Disp: 60 tablet, Rfl: 3 .  Dolutegravir-lamiVUDine (DOVATO) 50-300 MG TABS, Take 1 tablet by mouth daily., Disp: 30 tablet, Rfl: 11 .  meloxicam (MOBIC) 7.5 MG tablet, TAKE 1 TABLET(7.5 MG) BY MOUTH DAILY FOR 14 DAYS, Disp: 14 tablet, Rfl: 0 .  benzonatate (TESSALON) 200 MG capsule, Take 1 capsule (200 mg total) by mouth 3 (three) times daily as needed for cough. (Patient not taking: Reported on 10/30/2019), Disp: 30 capsule, Rfl: 0 .  colchicine 0.6 MG tablet, Take 1 tablet (0.6 mg total) by mouth daily. (Patient not taking: Reported on 01/29/2020), Disp: 30 tablet, Rfl: 2 .  diclofenac (VOLTAREN) 50 MG EC tablet, TAKE 1 TABLET(50 MG) BY MOUTH THREE TIMES DAILY AS NEEDED FOR MILD PAIN OR  MODERATE PAIN (Patient not taking: Reported on 01/29/2020), Disp: 63 tablet, Rfl: 0 .  diclofenac Sodium (VOLTAREN) 1 % GEL, Apply 4 g topically 4 (four) times daily. (Patient not taking: Reported on 01/29/2020), Disp: 150 g, Rfl: 2    Observations/Objective: Noble appeared to be doing well  Assessment and Plan:  HIV disease: continue Dovato and RTC for labs next week and visit w me later in September  Follow Up Instructions:    I discussed the assessment and treatment plan with the patient. The patient was provided an opportunity to ask questions and all were answered. The patient agreed with the plan and demonstrated an understanding of the instructions.   The patient was advised to call back or seek an in-person evaluation if the symptoms worsen or if the condition fails to improve as anticipated.  I provided  11 minutes of non-face-to-face time during this encounter.   Acey Lav, MD

## 2020-01-30 IMAGING — CR DG CHEST 2V
1 series · 2 of 2 positions shown · non-contrast
Comparison: Report dated 12/10/2001.

CLINICAL DATA: Smoker with cough, shortness of breath and
intermittent fever.

EXAM:
CHEST - 2 VIEW

[Series 1: w chest pa · 0.14mm/px · 2 of 2 slices shown]
[im 1/2]
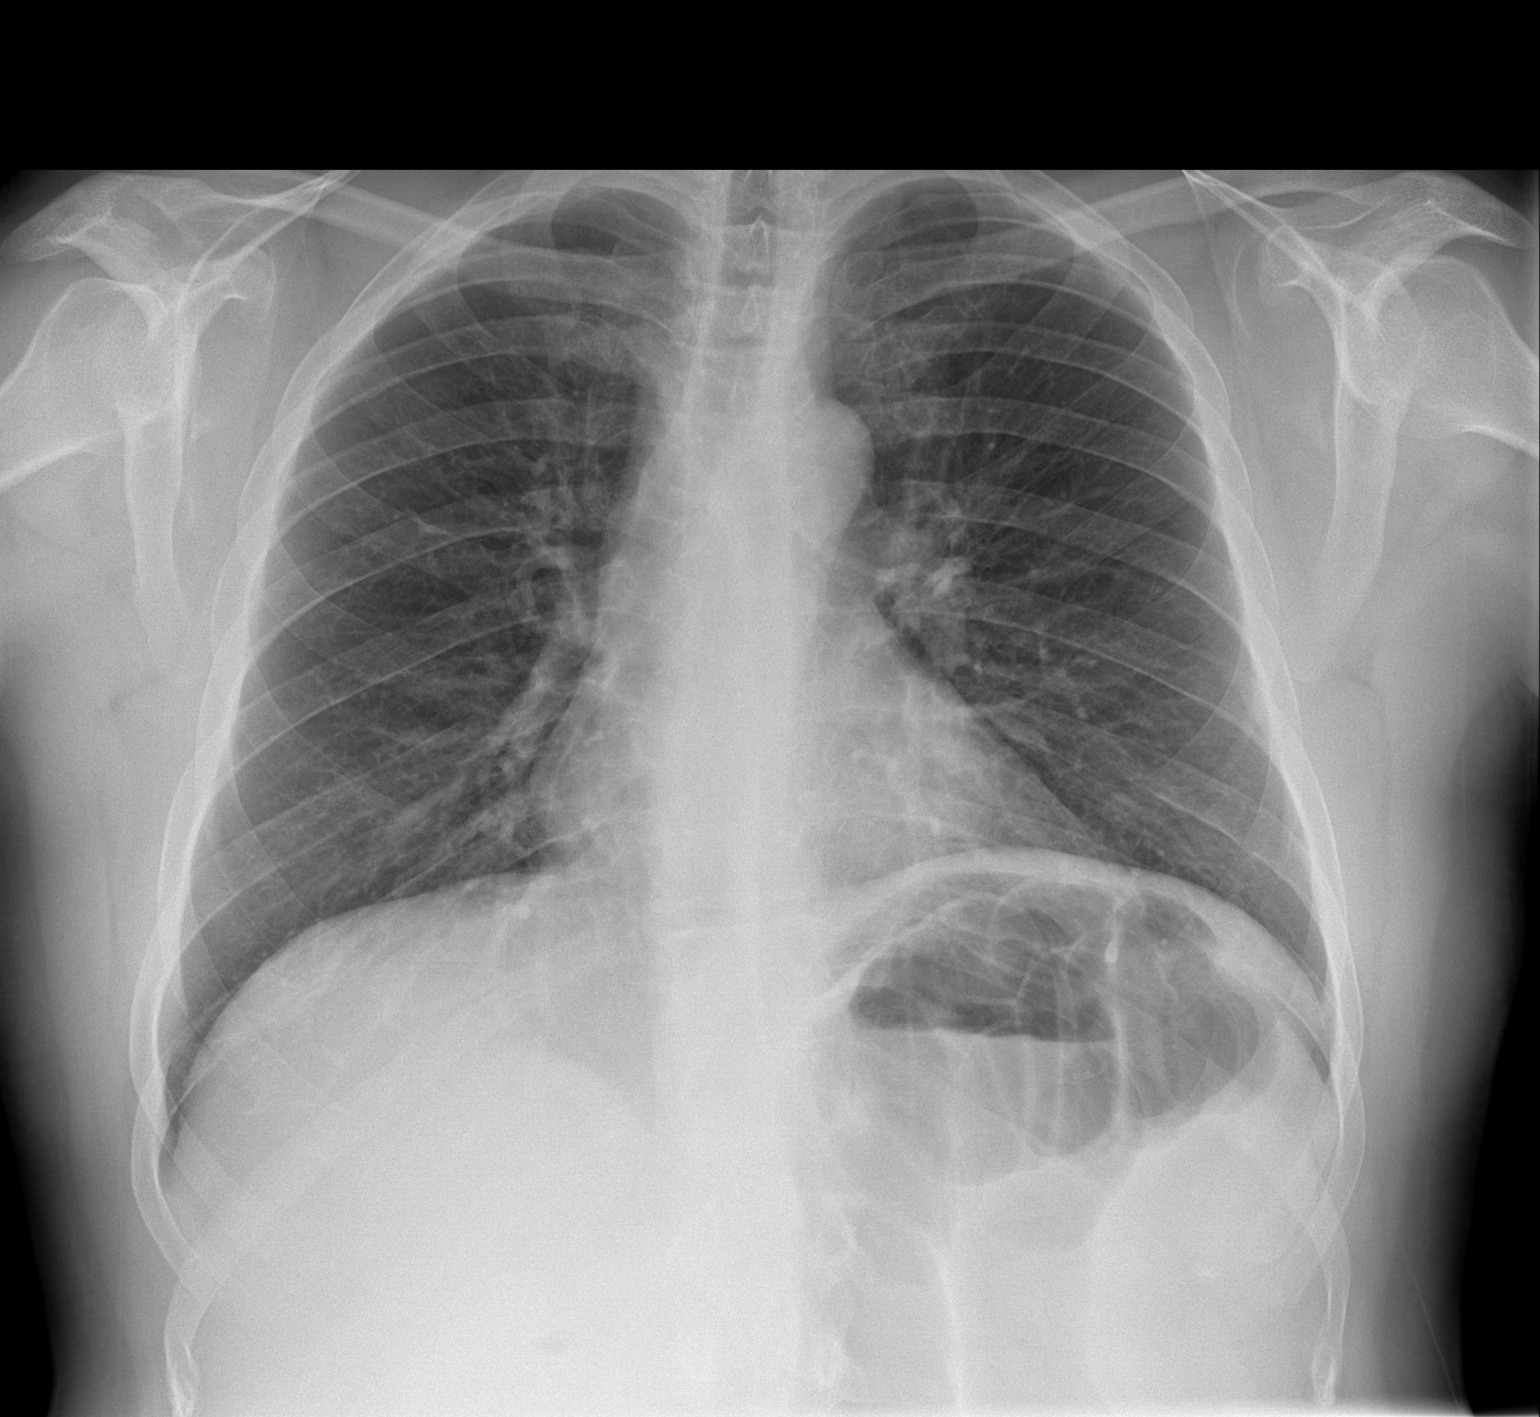
[im 2/2]
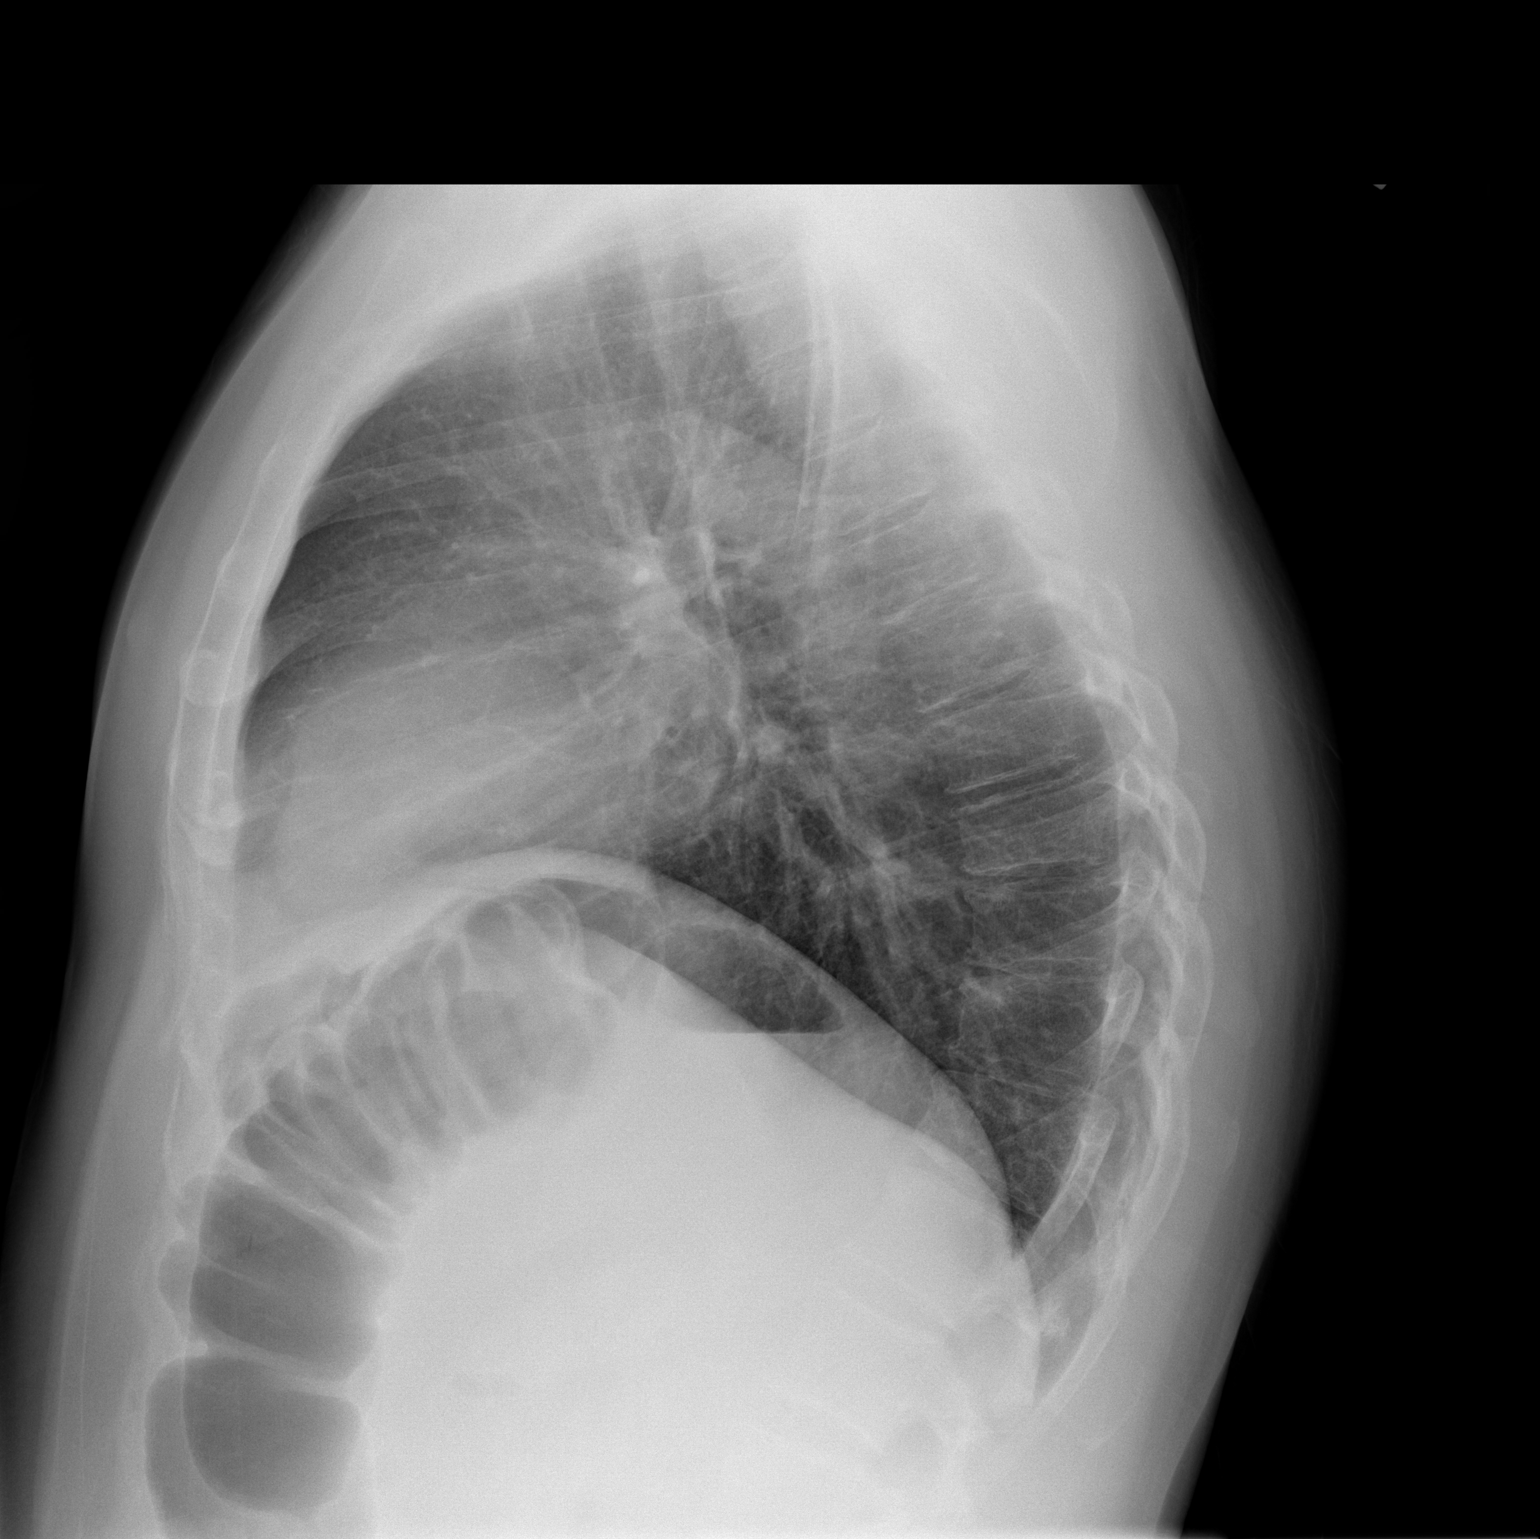

[2 of 2 positions shown; findings below may reference images not displayed]

FINDINGS: Normal sized heart. Clear lungs. Minimal peribronchial thickening.
Unremarkable bones.
IMPRESSION: Minimal bronchitic changes.

## 2020-02-05 ENCOUNTER — Other Ambulatory Visit: Payer: Self-pay

## 2020-02-05 ENCOUNTER — Ambulatory Visit: Payer: Self-pay

## 2020-02-05 DIAGNOSIS — B20 Human immunodeficiency virus [HIV] disease: Secondary | ICD-10-CM

## 2020-02-06 LAB — T-HELPER CELL (CD4) - (RCID CLINIC ONLY)
CD4 % Helper T Cell: 24 % — ABNORMAL LOW (ref 33–65)
CD4 T Cell Abs: 617 /uL (ref 400–1790)

## 2020-02-08 LAB — CBC WITH DIFFERENTIAL/PLATELET
Absolute Monocytes: 542 cells/uL (ref 200–950)
Basophils Absolute: 32 cells/uL (ref 0–200)
Basophils Relative: 0.5 %
Eosinophils Absolute: 120 cells/uL (ref 15–500)
Eosinophils Relative: 1.9 %
HCT: 44 % (ref 38.5–50.0)
Hemoglobin: 15.7 g/dL (ref 13.2–17.1)
Lymphs Abs: 2558 cells/uL (ref 850–3900)
MCH: 33.1 pg — ABNORMAL HIGH (ref 27.0–33.0)
MCHC: 35.7 g/dL (ref 32.0–36.0)
MCV: 92.8 fL (ref 80.0–100.0)
MPV: 9.1 fL (ref 7.5–12.5)
Monocytes Relative: 8.6 %
Neutro Abs: 3049 cells/uL (ref 1500–7800)
Neutrophils Relative %: 48.4 %
Platelets: 258 10*3/uL (ref 140–400)
RBC: 4.74 10*6/uL (ref 4.20–5.80)
RDW: 11.9 % (ref 11.0–15.0)
Total Lymphocyte: 40.6 %
WBC: 6.3 10*3/uL (ref 3.8–10.8)

## 2020-02-08 LAB — COMPLETE METABOLIC PANEL WITH GFR
AG Ratio: 2 (calc) (ref 1.0–2.5)
ALT: 26 U/L (ref 9–46)
AST: 17 U/L (ref 10–40)
Albumin: 4.4 g/dL (ref 3.6–5.1)
Alkaline phosphatase (APISO): 56 U/L (ref 36–130)
BUN: 14 mg/dL (ref 7–25)
CO2: 29 mmol/L (ref 20–32)
Calcium: 9.5 mg/dL (ref 8.6–10.3)
Chloride: 103 mmol/L (ref 98–110)
Creat: 1.16 mg/dL (ref 0.60–1.35)
GFR, Est African American: 91 mL/min/{1.73_m2} (ref >=60)
GFR, Est Non African American: 79 mL/min/{1.73_m2} (ref >=60)
Globulin: 2.2 g/dL (calc) (ref 1.9–3.7)
Glucose, Bld: 101 mg/dL — ABNORMAL HIGH (ref 65–99)
Potassium: 4.1 mmol/L (ref 3.5–5.3)
Sodium: 139 mmol/L (ref 135–146)
Total Bilirubin: 0.7 mg/dL (ref 0.2–1.2)
Total Protein: 6.6 g/dL (ref 6.1–8.1)

## 2020-02-08 LAB — HIV-1 RNA QUANT-NO REFLEX-BLD
HIV 1 RNA Quant: <20 Copies/mL — ABNORMAL HIGH
HIV-1 RNA Quant, Log: <1.3 Log cps/mL — ABNORMAL HIGH

## 2020-02-08 LAB — RPR: RPR Ser Ql: NONREACTIVE

## 2020-02-12 MED FILL — AMLODIPINE BESYLATE 10 MG T: 10 | 30 days supply | Qty: 15 | Fill #1

## 2020-02-16 ENCOUNTER — Ambulatory Visit: Payer: Self-pay | Admitting: Infectious Disease

## 2020-02-19 ENCOUNTER — Telehealth (INDEPENDENT_AMBULATORY_CARE_PROVIDER_SITE_OTHER): Payer: Self-pay | Admitting: Infectious Disease

## 2020-02-19 ENCOUNTER — Other Ambulatory Visit: Payer: Self-pay

## 2020-02-19 ENCOUNTER — Ambulatory Visit (INDEPENDENT_AMBULATORY_CARE_PROVIDER_SITE_OTHER): Payer: Self-pay

## 2020-02-19 DIAGNOSIS — B20 Human immunodeficiency virus [HIV] disease: Secondary | ICD-10-CM

## 2020-02-19 DIAGNOSIS — Z23 Encounter for immunization: Secondary | ICD-10-CM

## 2020-02-19 MED ORDER — DOVATO 50-300 MG PO TABS
1.0000 | ORAL_TABLET | Freq: Every day | ORAL | 11 refills | Status: DC
Start: 2020-02-19 — End: 2020-12-27

## 2020-02-19 NOTE — Progress Notes (Signed)
Virtual Visit via Telephone Note  I connected with Hayden Hicks on 02/19/20 at  9:45 AM EDT by telephone and verified that I am speaking with the correct person using two identifiers.  Location: Patient: Home Provider: My home   I discussed the limitations, risks, security and privacy concerns of performing an evaluation and management service by telephone and the availability of in person appointments. I also discussed with the patient that there may be a patient responsible charge related to this service. The patient expressed understanding and agreed to proceed.   History of Present Illness:  39 year male who had been doing superbly well on his  antiviral regimen, switched from [facility] to  [facility]  with undetectable viral load and health cd4 count.  I  switched him to DOVATO and he has remained suppressed.   He has had COVID 19 vaccines.  He needs flu vaccine as does his partner who is on PrEP through our PrEP clinic.   Past Medical History:  Diagnosis Date  . Arthritis   . Cryptosporidial gastroenteritis (HCC)   . Elevated troponin   . HIV infection (HCC)   . Hypertension   . Pericarditis     Past Surgical History:  Procedure Laterality Date  . KNEE ARTHROSCOPY      No family history on file.    Social History   Socioeconomic History  . Marital status: Single    Spouse name: Not on file  . Number of children: Not on file  . Years of education: Not on file  . Highest education level: Not on file  Occupational History  . Not on file  Tobacco Use  . Smoking status: Former Smoker    Types: Cigarettes    Quit date: 10/27/2017    Years since quitting: 2.3  . Smokeless tobacco: Never Used  . Tobacco comment: occ  Vaping Use  . Vaping Use: Some days  Substance and Sexual Activity  . Alcohol use: No    Comment: social  . Drug use: Yes    Types: Marijuana  . Sexual activity: Yes    Partners: Male  Other Topics Concern  . Not on file  Social History  Narrative  . Not on file   Social Determinants of Health   Financial Resource Strain:   . Difficulty of Paying Living Expenses: Not on file  Food Insecurity:   . Worried About Programme researcher, broadcasting/film/video in the Last Year: Not on file  . Ran Out of Food in the Last Year: Not on file  Transportation Needs:   . Lack of Transportation (Medical): Not on file  . Lack of Transportation (Non-Medical): Not on file  Physical Activity:   . Days of Exercise per Week: Not on file  . Minutes of Exercise per Session: Not on file  Stress:   . Feeling of Stress : Not on file  Social Connections:   . Frequency of Communication with Friends and Family: Not on file  . Frequency of Social Gatherings with Friends and Family: Not on file  . Attends Religious Services: Not on file  . Active Member of Clubs or Organizations: Not on file  . Attends Banker Meetings: Not on file  . Marital Status: Not on file    No Known Allergies   Current Outpatient Medications:  .  amLODipine (NORVASC) 10 MG tablet, Take 0.5 tablets (5 mg total) by mouth daily., Disp: 60 tablet, Rfl: 3 .  Dolutegravir-lamiVUDine (DOVATO) 50-300 MG TABS,  Take 1 tablet by mouth daily., Disp: 30 tablet, Rfl: 11 .  albuterol (VENTOLIN HFA) 108 (90 Base) MCG/ACT inhaler, Inhale 2 puffs into the lungs every 6 (six) hours as needed for wheezing or shortness of breath. (Patient not taking: Reported on 02/19/2020), Disp: 16 g, Rfl: 0 .  benzonatate (TESSALON) 200 MG capsule, Take 1 capsule (200 mg total) by mouth 3 (three) times daily as needed for cough. (Patient not taking: Reported on 10/30/2019), Disp: 30 capsule, Rfl: 0 .  colchicine 0.6 MG tablet, Take 1 tablet (0.6 mg total) by mouth daily. (Patient not taking: Reported on 01/29/2020), Disp: 30 tablet, Rfl: 2 .  diclofenac (VOLTAREN) 50 MG EC tablet, TAKE 1 TABLET(50 MG) BY MOUTH THREE TIMES DAILY AS NEEDED FOR MILD PAIN OR MODERATE PAIN (Patient not taking: Reported on 01/29/2020), Disp: 63  tablet, Rfl: 0 .  diclofenac Sodium (VOLTAREN) 1 % GEL, Apply 4 g topically 4 (four) times daily. (Patient not taking: Reported on 01/29/2020), Disp: 150 g, Rfl: 2 .  meloxicam (MOBIC) 7.5 MG tablet, TAKE 1 TABLET(7.5 MG) BY MOUTH DAILY FOR 14 DAYS (Patient not taking: Reported on 02/19/2020), Disp: 14 tablet, Rfl: 0  ROS is negative on 12 pt review   Observations/Objective: Cashus appeared to be doing well  Assessment and Plan:  HIV disease: continue Dovato, renew HMAP in January and RTC w labs and to see me in September  Follow Up Instructions:    I discussed the assessment and treatment plan with the patient. The patient was provided an opportunity to ask questions and all were answered. The patient agreed with the plan and demonstrated an understanding of the instructions.   The patient was advised to call back or seek an in-person evaluation if the symptoms worsen or if the condition fails to improve as anticipated.  I provided  21 minutes of non-face-to-face time during this encounter.   Acey Lav, MD

## 2020-02-20 ENCOUNTER — Ambulatory Visit: Payer: Self-pay

## 2020-03-12 ENCOUNTER — Encounter: Payer: Self-pay | Admitting: Infectious Disease

## 2020-03-30 ENCOUNTER — Emergency Department: Admission: EM | Admit: 2020-03-30 | Discharge: 2020-03-30 | Discharge status: Against Medical Advice | Payer: Self-pay

## 2020-05-10 ENCOUNTER — Telehealth: Payer: Self-pay | Admitting: *Deleted

## 2020-05-10 NOTE — Telephone Encounter (Signed)
I agree. Thank you.

## 2020-05-10 NOTE — Telephone Encounter (Signed)
Patient called in stating he received his booster vaccine on Saturday and still has headache and feels lightheaded. Explained these are usual side effects from booster vaccines, especially within the first 48-72 hours. Encouraged him to drink plenty of fluids, take whatever he usually takes for pain, and rest. He asked if he should go to work. Advised if he doesn't feel well enough to let his employer know. He was advised to call back if symptoms persist. L. Kathia Covington, BSN, RN-BC

## 2020-05-31 ENCOUNTER — Ambulatory Visit: Payer: Self-pay

## 2020-06-02 ENCOUNTER — Ambulatory Visit: Payer: Self-pay

## 2020-06-02 ENCOUNTER — Other Ambulatory Visit: Payer: Self-pay

## 2020-07-30 ENCOUNTER — Encounter: Payer: Self-pay | Admitting: Infectious Disease

## 2020-10-25 ENCOUNTER — Encounter: Payer: Self-pay | Admitting: *Deleted

## 2020-11-30 ENCOUNTER — Encounter: Payer: Self-pay | Admitting: *Deleted

## 2020-12-03 ENCOUNTER — Other Ambulatory Visit: Payer: Self-pay

## 2020-12-03 DIAGNOSIS — B20 Human immunodeficiency virus [HIV] disease: Secondary | ICD-10-CM

## 2020-12-03 DIAGNOSIS — Z113 Encounter for screening for infections with a predominantly sexual mode of transmission: Secondary | ICD-10-CM

## 2020-12-03 DIAGNOSIS — Z79899 Other long term (current) drug therapy: Secondary | ICD-10-CM

## 2020-12-06 ENCOUNTER — Other Ambulatory Visit: Payer: Self-pay

## 2020-12-07 ENCOUNTER — Other Ambulatory Visit: Payer: Self-pay

## 2020-12-07 DIAGNOSIS — B20 Human immunodeficiency virus [HIV] disease: Secondary | ICD-10-CM

## 2020-12-07 DIAGNOSIS — Z79899 Other long term (current) drug therapy: Secondary | ICD-10-CM

## 2020-12-07 DIAGNOSIS — Z113 Encounter for screening for infections with a predominantly sexual mode of transmission: Secondary | ICD-10-CM

## 2020-12-08 LAB — T-HELPER CELL (CD4) - (RCID CLINIC ONLY)
CD4 % Helper T Cell: 28 % — ABNORMAL LOW (ref 33–65)
CD4 T Cell Abs: 632 /uL (ref 400–1790)

## 2020-12-09 LAB — CBC WITH DIFFERENTIAL/PLATELET
Absolute Monocytes: 448 cells/uL (ref 200–950)
Basophils Absolute: 58 cells/uL (ref 0–200)
Basophils Relative: 0.9 %
Eosinophils Absolute: 179 cells/uL (ref 15–500)
Eosinophils Relative: 2.8 %
HCT: 50.4 % — ABNORMAL HIGH (ref 38.5–50.0)
Hemoglobin: 17.6 g/dL — ABNORMAL HIGH (ref 13.2–17.1)
Lymphs Abs: 2259 cells/uL (ref 850–3900)
MCH: 32.3 pg (ref 27.0–33.0)
MCHC: 34.9 g/dL (ref 32.0–36.0)
MCV: 92.5 fL (ref 80.0–100.0)
MPV: 9.5 fL (ref 7.5–12.5)
Monocytes Relative: 7 %
Neutro Abs: 3456 cells/uL (ref 1500–7800)
Neutrophils Relative %: 54 %
Platelets: 273 10*3/uL (ref 140–400)
RBC: 5.45 10*6/uL (ref 4.20–5.80)
RDW: 12.4 % (ref 11.0–15.0)
Total Lymphocyte: 35.3 %
WBC: 6.4 10*3/uL (ref 3.8–10.8)

## 2020-12-09 LAB — COMPLETE METABOLIC PANEL WITH GFR
AG Ratio: 1.7 (calc) (ref 1.0–2.5)
ALT: 34 U/L (ref 9–46)
AST: 19 U/L (ref 10–40)
Albumin: 4.5 g/dL (ref 3.6–5.1)
Alkaline phosphatase (APISO): 65 U/L (ref 36–130)
BUN: 10 mg/dL (ref 7–25)
CO2: 31 mmol/L (ref 20–32)
Calcium: 10.7 mg/dL — ABNORMAL HIGH (ref 8.6–10.3)
Chloride: 100 mmol/L (ref 98–110)
Creat: 1.07 mg/dL (ref 0.60–1.29)
Globulin: 2.6 g/dL (calc) (ref 1.9–3.7)
Glucose, Bld: 178 mg/dL — ABNORMAL HIGH (ref 65–99)
Potassium: 4.2 mmol/L (ref 3.5–5.3)
Sodium: 137 mmol/L (ref 135–146)
Total Bilirubin: 0.4 mg/dL (ref 0.2–1.2)
Total Protein: 7.1 g/dL (ref 6.1–8.1)
eGFR: 90 mL/min/{1.73_m2} (ref >=60)

## 2020-12-09 LAB — LIPID PANEL
Cholesterol: 227 mg/dL — ABNORMAL HIGH (ref <200)
HDL: 61 mg/dL (ref >=40)
LDL Cholesterol (Calc): 133 mg/dL (calc) — ABNORMAL HIGH
Non-HDL Cholesterol (Calc): 166 mg/dL (calc) — ABNORMAL HIGH (ref <130)
Total CHOL/HDL Ratio: 3.7 (calc) (ref <5.0)
Triglycerides: 192 mg/dL — ABNORMAL HIGH (ref <150)

## 2020-12-09 LAB — RPR: RPR Ser Ql: NONREACTIVE

## 2020-12-09 LAB — HIV-1 RNA QUANT-NO REFLEX-BLD
HIV 1 RNA Quant: <20 Copies/mL — ABNORMAL HIGH
HIV-1 RNA Quant, Log: <1.3 Log cps/mL — ABNORMAL HIGH

## 2020-12-27 ENCOUNTER — Ambulatory Visit: Payer: Self-pay

## 2020-12-27 ENCOUNTER — Other Ambulatory Visit: Payer: Self-pay

## 2020-12-27 ENCOUNTER — Ambulatory Visit (INDEPENDENT_AMBULATORY_CARE_PROVIDER_SITE_OTHER): Payer: Self-pay | Admitting: Infectious Disease

## 2020-12-27 ENCOUNTER — Telehealth: Payer: Self-pay

## 2020-12-27 ENCOUNTER — Encounter: Payer: Self-pay | Admitting: Infectious Disease

## 2020-12-27 VITALS — BP 152/87 | HR 71 | Temp 98.2°F | Wt 200.0 lb

## 2020-12-27 DIAGNOSIS — I10 Essential (primary) hypertension: Secondary | ICD-10-CM

## 2020-12-27 DIAGNOSIS — G5603 Carpal tunnel syndrome, bilateral upper limbs: Secondary | ICD-10-CM

## 2020-12-27 DIAGNOSIS — Z6835 Body mass index (BMI) 35.0-35.9, adult: Secondary | ICD-10-CM

## 2020-12-27 DIAGNOSIS — E669 Obesity, unspecified: Secondary | ICD-10-CM

## 2020-12-27 DIAGNOSIS — E66812 Obesity, class 2: Secondary | ICD-10-CM

## 2020-12-27 DIAGNOSIS — R739 Hyperglycemia, unspecified: Secondary | ICD-10-CM

## 2020-12-27 DIAGNOSIS — F4323 Adjustment disorder with mixed anxiety and depressed mood: Secondary | ICD-10-CM

## 2020-12-27 DIAGNOSIS — F17210 Nicotine dependence, cigarettes, uncomplicated: Secondary | ICD-10-CM

## 2020-12-27 DIAGNOSIS — B20 Human immunodeficiency virus [HIV] disease: Secondary | ICD-10-CM

## 2020-12-27 HISTORY — DX: Obesity, unspecified: E66.9

## 2020-12-27 MED ORDER — AMLODIPINE BESYLATE 10 MG PO TABS: 10.0000 mg | ORAL_TABLET | Freq: Every day | ORAL | 11 refills | Status: DC

## 2020-12-27 MED ORDER — GABAPENTIN 300 MG PO CAPS: ORAL_CAPSULE | ORAL | 3 refills | Status: DC

## 2020-12-27 MED ORDER — DOVATO 50-300 MG PO TABS: 1.0000 | ORAL_TABLET | Freq: Every day | ORAL | 11 refills | Status: DC

## 2020-12-27 NOTE — Progress Notes (Signed)
Subjective:  Complaints of numbness in his hands especially with work and driving along with elevated blood pressure  Patient ID: Hayden Hicks, male    DOB: [birth year], 40 y.o.   MRN: [id number]  HPI  Hayden Hicks is a 40 year old Caucasian man living with HIV that is been perfectly controlled more recently on Dovato.  He did go to an ER a few months ago with burning pain and numbness in his hands bilaterally.  He has pain particularly when he is working for prolonged periods of time with his hands.  He was given a prescription for a splint which she wears sometimes but not consistently.  He also was given a prescription for gabapentin to manage the pain.  Amlodipine was prescribed for blood pressure and he was told that he needed to establish himself with a primary care.  He has of note been seen several times in the internal medicine teaching clinic service and I would like him to go back and see them if this is possible.    Past Medical History:  Diagnosis Date   Arthritis    Cryptosporidial gastroenteritis (HCC)    Elevated troponin    HIV infection (HCC)    Hypertension    Pericarditis     Past Surgical History:  Procedure Laterality Date   KNEE ARTHROSCOPY      No family history on file.    Social History   Socioeconomic History   Marital status: Single    Spouse name: Not on file   Number of children: Not on file   Years of education: Not on file   Highest education level: Not on file  Occupational History   Not on file  Tobacco Use   Smoking status: Former    Types: Cigarettes    Quit date: 10/27/2017    Years since quitting: 3.1   Smokeless tobacco: Never   Tobacco comments:    occ  Vaping Use   Vaping Use: Some days  Substance and Sexual Activity   Alcohol use: No    Comment: social   Drug use: Yes    Types: Marijuana   Sexual activity: Yes    Partners: Male  Other Topics Concern   Not on file  Social History Narrative   Not on file   Social  Determinants of Health   Financial Resource Strain: Not on file  Food Insecurity: Not on file  Transportation Needs: Not on file  Physical Activity: Not on file  Stress: Not on file  Social Connections: Not on file    No Known Allergies   Current Outpatient Medications:    albuterol (VENTOLIN HFA) 108 (90 Base) MCG/ACT inhaler, Inhale 2 puffs into the lungs every 6 (six) hours as needed for wheezing or shortness of breath. (Patient not taking: Reported on 02/19/2020), Disp: 16 g, Rfl: 0   amLODipine (NORVASC) 10 MG tablet, Take 0.5 tablets (5 mg total) by mouth daily., Disp: 60 tablet, Rfl: 3   benzonatate (TESSALON) 200 MG capsule, Take 1 capsule (200 mg total) by mouth 3 (three) times daily as needed for cough. (Patient not taking: Reported on 10/30/2019), Disp: 30 capsule, Rfl: 0   colchicine 0.6 MG tablet, Take 1 tablet (0.6 mg total) by mouth daily. (Patient not taking: Reported on 01/29/2020), Disp: 30 tablet, Rfl: 2   diclofenac (VOLTAREN) 50 MG EC tablet, TAKE 1 TABLET(50 MG) BY MOUTH THREE TIMES DAILY AS NEEDED FOR MILD PAIN OR MODERATE PAIN (Patient not taking: Reported on 01/29/2020),  Disp: 63 tablet, Rfl: 0   diclofenac Sodium (VOLTAREN) 1 % GEL, Apply 4 g topically 4 (four) times daily. (Patient not taking: Reported on 01/29/2020), Disp: 150 g, Rfl: 2   Dolutegravir-lamiVUDine (DOVATO) 50-300 MG TABS, Take 1 tablet by mouth daily., Disp: 30 tablet, Rfl: 11   meloxicam (MOBIC) 7.5 MG tablet, TAKE 1 TABLET(7.5 MG) BY MOUTH DAILY FOR 14 DAYS (Patient not taking: Reported on 02/19/2020), Disp: 14 tablet, Rfl: 0    Review of Systems  Constitutional:  Negative for chills and fever.  HENT:  Negative for congestion and sore throat.   Eyes:  Negative for photophobia.  Respiratory:  Negative for cough, shortness of breath and wheezing.   Cardiovascular:  Negative for chest pain, palpitations and leg swelling.  Gastrointestinal:  Negative for abdominal pain, blood in stool, constipation,  diarrhea, nausea and vomiting.  Genitourinary:  Negative for dysuria, flank pain and hematuria.  Musculoskeletal:  Negative for arthralgias, back pain, joint swelling and myalgias.  Skin:  Negative for rash.  Neurological:  Positive for numbness. Negative for dizziness, weakness and headaches.  Hematological:  Negative for adenopathy. Does not bruise/bleed easily.  Psychiatric/Behavioral:  Negative for agitation, behavioral problems, decreased concentration, hallucinations and suicidal ideas. The patient is not nervous/anxious.       Objective:   Physical Exam Constitutional:      General: He is not in acute distress.    Appearance: He is obese. He is not diaphoretic.  HENT:     Head: Normocephalic and atraumatic.     Right Ear: External ear normal.     Left Ear: External ear normal.     Nose: Nose normal.     Mouth/Throat:     Pharynx: No oropharyngeal exudate.  Eyes:     General: No scleral icterus.       Right eye: No discharge.        Left eye: No discharge.     Extraocular Movements: Extraocular movements intact.     Conjunctiva/sclera: Conjunctivae normal.  Cardiovascular:     Rate and Rhythm: Normal rate and regular rhythm.     Heart sounds:    No friction rub.  Pulmonary:     Effort: Pulmonary effort is normal. No respiratory distress.     Breath sounds: No wheezing.  Abdominal:     General: There is no distension.     Palpations: Abdomen is soft.  Musculoskeletal:        General: Normal range of motion.     Cervical back: Normal range of motion and neck supple.  Lymphadenopathy:     Cervical: No cervical adenopathy.  Skin:    General: Skin is warm and dry.     Coloration: Skin is not jaundiced or pale.     Findings: No erythema, lesion or rash.  Neurological:     General: No focal deficit present.     Mental Status: He is alert and oriented to person, place, and time.     Coordination: Coordination normal.  Psychiatric:        Mood and Affect: Mood normal.         Behavior: Behavior normal.        Thought Content: Thought content normal.        Judgment: Judgment normal.    + Tinel's test      Assessment & Plan:   New onset numbness in hands: this seems more likely be consistent with carpal tunnel syndrome rather than HIV neuropathy or diabetic  associate neuropathy.  I have renewed his gabapentin I think clearly he needs to wear the splint and also ultimately to get an ACA plan so he can be seen by a hand surgeon if he needs carpal tunnel release surgery.  HIV disease: I have reviewed his labs and his viral load remains less than 20 or undetectable and his CD4 count is healthy at 632.  Hyperglycemia new problem I noticed his blood sugar on most recent lab draw was 178 he is not known to be diabetic but not clear whether he was fasting or not it was performed I am ordering hemoglobin A1c see if he might be diabetic or prediabetic  Hypertension: Chronic problem not been optimally controlled I have renewed his amlodipine today I would like him to get back in with his primary care and he has been seen in internal medicine clinic we will send him a note see if he needs a referral.  Obesity: BMI is 30.41 he has improved versus last August when he weighed 207.7 pounds BMI was 31.58.  Losing weight would help him greatly with his blood pressure and if he is prediabetic certainly with that as well.  He will continue to work on this  \ Major depression has been a chronic issue but has been relatively better controlled recently.  Smoking I have counseled him to stop smoking.

## 2020-12-27 NOTE — Telephone Encounter (Signed)
Received call today from [facility] regarding prescription for Gabapentin. Prescription has two different instructions listed for medication. Pharmacy would like to confirm which direction patient should follow.  Forwarding call to MD for clarification. Will call pharmacy back with update. Juanita Laster, RMA

## 2020-12-28 LAB — HEMOGLOBIN A1C
Hgb A1c MFr Bld: 5.7 % of total Hgb — ABNORMAL HIGH (ref <5.7)
Mean Plasma Glucose: 117 mg/dL
eAG (mmol/L): 6.5 mmol/L

## 2021-01-03 ENCOUNTER — Telehealth: Payer: Self-pay

## 2021-01-03 DIAGNOSIS — I10 Essential (primary) hypertension: Secondary | ICD-10-CM

## 2021-01-03 MED ORDER — AMLODIPINE BESYLATE 10 MG PO TABS: 10.0000 mg | ORAL_TABLET | Freq: Every day | ORAL | 11 refills | Status: DC

## 2021-01-03 MED ORDER — GABAPENTIN 300 MG PO CAPS: ORAL_CAPSULE | ORAL | 3 refills | Status: DC

## 2021-01-03 NOTE — Telephone Encounter (Signed)
Patient called, states he can't afford gabapentin and amlodipine. He has ADAP and these medications were sent to a [facility] in [place], RN called and canceled prescriptions there. Resent medications to [facility] on [address] in [place]. Gave patient their phone number and advised him to call and set up delivery and let us know if he continues to have problems filling his medications. Patient verbalized understanding and has no further questions.    Sandie Ano, RN

## 2021-01-17 ENCOUNTER — Encounter: Payer: Self-pay | Admitting: Infectious Disease

## 2021-02-22 ENCOUNTER — Ambulatory Visit: Payer: Self-pay

## 2021-03-02 ENCOUNTER — Telehealth: Payer: Self-pay

## 2021-03-02 ENCOUNTER — Other Ambulatory Visit: Payer: Self-pay

## 2021-03-02 DIAGNOSIS — B20 Human immunodeficiency virus [HIV] disease: Secondary | ICD-10-CM

## 2021-03-02 NOTE — Telephone Encounter (Signed)
Patient called office to follow up on text message from [facility] stating Dovato refill is pending on provider's response. Per chart patient was sent year supply of medication to [facility].  Informed him that he should be able to pick up rx without any issues. Advised he call [facility] for more information and to refill medicine.  Patient will be transferring all his medication to [facility] on [address]. Will call them to transfer medication and set up automatic refills.  Patient is rescheduled for flu shot on 10/7. Juanita Laster, RMA

## 2021-03-04 ENCOUNTER — Other Ambulatory Visit: Payer: Self-pay

## 2021-03-04 ENCOUNTER — Ambulatory Visit (INDEPENDENT_AMBULATORY_CARE_PROVIDER_SITE_OTHER): Payer: Self-pay

## 2021-03-04 DIAGNOSIS — Z23 Encounter for immunization: Secondary | ICD-10-CM

## 2021-05-09 ENCOUNTER — Telehealth: Payer: Self-pay

## 2021-05-10 ENCOUNTER — Other Ambulatory Visit: Payer: Self-pay

## 2021-05-10 MED ORDER — GABAPENTIN 300 MG PO CAPS: ORAL_CAPSULE | ORAL | 3 refills | Status: DC

## 2021-05-10 NOTE — Telephone Encounter (Signed)
Rx sent 

## 2021-05-13 ENCOUNTER — Other Ambulatory Visit: Payer: Self-pay

## 2021-05-13 ENCOUNTER — Ambulatory Visit (INDEPENDENT_AMBULATORY_CARE_PROVIDER_SITE_OTHER): Payer: Self-pay | Admitting: Internal Medicine

## 2021-05-13 ENCOUNTER — Encounter: Payer: Self-pay | Admitting: Internal Medicine

## 2021-05-13 DIAGNOSIS — G8929 Other chronic pain: Secondary | ICD-10-CM

## 2021-05-13 DIAGNOSIS — M5442 Lumbago with sciatica, left side: Secondary | ICD-10-CM

## 2021-05-13 DIAGNOSIS — M5441 Lumbago with sciatica, right side: Secondary | ICD-10-CM

## 2021-05-13 MED ORDER — GABAPENTIN 400 MG PO CAPS: ORAL_CAPSULE | ORAL | 1 refills | Status: DC

## 2021-05-13 NOTE — Progress Notes (Signed)
° °  CC: back pain  HPI:[name] is a 40 y.o. male who presents for evaluation of chronic back pain. Please see individual problem based A/P for details.   Past Medical History:  Diagnosis Date   Arthritis    Cryptosporidial gastroenteritis (HCC)    Elevated troponin    HIV infection (HCC)    Hypertension    Obesity 12/27/2020   Pericarditis    Review of Systems:   Review of Systems  Constitutional:  Negative for chills and fever.  Musculoskeletal:  Positive for back pain. Negative for falls.  Neurological:  Negative for focal weakness and weakness.    Physical Exam: Vitals:   05/13/21 1052  BP: (!) 153/93  Pulse: 73  Resp: (!) 24  Temp: 98.7 F (37.1 C)  TempSrc: Oral  SpO2: 100%  Weight: 201 lb 3.2 oz (91.3 kg)  Height: 5\' 11"  (1.803 m)   General: NAD, nl appearance No gross deformity, scoliosis. No midline or bony TTP. FROM. Strength LEs 5/5 all muscle groups.   2+ MSRs in L patellar , 3+ in right paterllar tendon.2+ achilles tendons, bilaterally. Negative SLRs. Sensation intact to light touch bilaterally.   Assessment & Plan:   See Encounters Tab for problem based charting.  Patient discussed with Dr. 

## 2021-05-13 NOTE — Patient Instructions (Signed)
Thank you for trusting me with your care. To recap, today we discussed the following:  Back Pain - Increase Gabapentin dosage - I recommend Yoga - Lidocaine patches, voltaren cream, tylenol 1000 mg  , and occasional aleve for pain relief Main stay of treatment is strengthening back muscles.

## 2021-05-16 ENCOUNTER — Encounter: Payer: Self-pay | Admitting: Internal Medicine

## 2021-05-16 DIAGNOSIS — M549 Dorsalgia, unspecified: Secondary | ICD-10-CM | POA: Insufficient documentation

## 2021-05-16 NOTE — Assessment & Plan Note (Signed)
°  Patient presents with a one week history of acute episode of aching back pain located in mid lower back. He has had similar pain on and off for approximately a year. In addition he has some pain in his cervical spine occasionally. The pain is not present on exam today. He sometimes has shooting pain down both his legs. He states that pain was evaluated with xray at neighboring hospital system.The pain is aggravated by physical activity. He has tried tylenol with mild improvement. The patient displays the following "red-flag symptoms": new urinary retention, progressive motor weakness , night sweats, and saddle anesthesia.   09/20/20 Lumbar and Thoracic xrays: Transitional lumbosacral vertebral body which will be presumed S1  for the purposes of the study. Otherwise, 5 lumbar type vertebral  bodies. Mild wedging of the T12 vertebral body. Borderline wedging  of the T11 vertebral body. Suboptimally evaluated. No ventral canal  encroachment. Facet arthropathy at L5-S1 and L4-5.   Assessment/Plan: Facet arthropathy of Lumbar spine L4-L5 and L5-S1, Mild wedging of thoracic, T12, borderline T11 . Patient is uninsured and can not afford evaluation with MRI. Likely would not change management at this time. Will increase patients Gabapentin for pain relief. Patient is working on Magazine features editor and recommend following up for referral to PT. Recommended Lidocaine patches, voltaren cream, tylenol 1000 mg  , and occasional aleve for pain relief

## 2021-05-24 NOTE — Progress Notes (Signed)
Internal Medicine Clinic Attending  Case discussed with Dr. Steen  At the time of the visit.  We reviewed the resident's history and exam and pertinent patient test results.  I agree with the assessment, diagnosis, and plan of care documented in the resident's note.  

## 2021-06-17 ENCOUNTER — Other Ambulatory Visit: Payer: Self-pay

## 2021-06-17 DIAGNOSIS — M5442 Lumbago with sciatica, left side: Secondary | ICD-10-CM

## 2021-06-17 DIAGNOSIS — G8929 Other chronic pain: Secondary | ICD-10-CM

## 2021-06-17 MED ORDER — GABAPENTIN 400 MG PO CAPS: ORAL_CAPSULE | ORAL | 1 refills | Status: DC

## 2021-09-16 ENCOUNTER — Ambulatory Visit: Payer: Self-pay

## 2021-09-16 ENCOUNTER — Other Ambulatory Visit: Payer: Self-pay

## 2021-09-23 ENCOUNTER — Other Ambulatory Visit: Payer: Self-pay | Admitting: Pharmacist

## 2021-09-23 DIAGNOSIS — B20 Human immunodeficiency virus [HIV] disease: Secondary | ICD-10-CM

## 2021-09-23 MED ORDER — DOVATO 50-300 MG PO TABS: 1.0000 | ORAL_TABLET | Freq: Every day | ORAL | 0 refills | Status: DC

## 2021-09-23 NOTE — Progress Notes (Signed)
Medication Samples have been provided to the patient. ? ?Drug name: Dovato        ?Strength: 50/300 mg         ?Qty: 1 bottle (14 tablets)   ?LOT: [id number]   ?Exp.Date: 01/2023 ? ?Dosing instructions: Take one tablet by mouth once daily ? ?The patient has been instructed regarding the correct time, dose, and frequency of taking this medication, including desired effects and most common side effects.  ? ?Ethan Kasperski L. Jonathin Heinicke, PharmD, BCIDP, AAHIVP, CPP ?Clinical Pharmacist Practitioner ?Infectious Diseases Clinical Pharmacist ?[facility] ?05/10/2020, 10:07 AM  ?

## 2021-09-27 ENCOUNTER — Other Ambulatory Visit (HOSPITAL_COMMUNITY): Payer: Self-pay

## 2021-09-27 ENCOUNTER — Telehealth: Payer: Self-pay

## 2021-09-27 DIAGNOSIS — B20 Human immunodeficiency virus [HIV] disease: Secondary | ICD-10-CM

## 2021-09-27 MED ORDER — DOVATO 50-300 MG PO TABS
1.0000 | ORAL_TABLET | Freq: Every day | ORAL | 3 refills | Status: DC
  Filled 2021-09-27 (×2): qty 30, 30d supply, fill #0
  Filled 2021-10-25: qty 30, 30d supply, fill #1
  Filled 2021-11-14: qty 30, 30d supply, fill #2
  Filled 2021-12-12 – 2021-12-15 (×2): qty 30, 30d supply, fill #3

## 2021-09-27 NOTE — Telephone Encounter (Signed)
Financial counselor, Deanna, requesting that Dovato be sent to [facility]. Canceled prescription at [facility] and sent to [facility]. Patient has been made aware per Deanna.  ? ?Sandie Ano, RN ? ?

## 2021-10-17 ENCOUNTER — Other Ambulatory Visit (HOSPITAL_COMMUNITY): Payer: Self-pay

## 2021-10-21 ENCOUNTER — Other Ambulatory Visit (HOSPITAL_COMMUNITY): Payer: Self-pay

## 2021-10-25 ENCOUNTER — Other Ambulatory Visit (HOSPITAL_COMMUNITY): Payer: Self-pay

## 2021-11-14 ENCOUNTER — Other Ambulatory Visit (HOSPITAL_COMMUNITY): Payer: Self-pay

## 2021-11-19 ENCOUNTER — Encounter: Payer: Self-pay | Admitting: *Deleted

## 2021-11-21 ENCOUNTER — Other Ambulatory Visit (HOSPITAL_COMMUNITY): Payer: Self-pay

## 2021-12-12 ENCOUNTER — Other Ambulatory Visit (HOSPITAL_COMMUNITY): Payer: Self-pay

## 2021-12-15 ENCOUNTER — Telehealth: Payer: Self-pay

## 2021-12-15 ENCOUNTER — Other Ambulatory Visit (HOSPITAL_COMMUNITY): Payer: Self-pay

## 2021-12-15 NOTE — Telephone Encounter (Signed)
[facility] Patient Advocate Encounter   Was successful in obtaining a Viiv copay card for Dovato.  This copay card will make the patients copay $0.00.  I have spoken with the patient.    The billing information is as follows and has been shared with [facility].         Joaopedro Eschbach, CPhT Specialty Pharmacy Patient Advocate [facility] Phone: [phone] Fax:  [phone]  

## 2022-01-04 ENCOUNTER — Ambulatory Visit: Payer: Self-pay

## 2022-01-04 ENCOUNTER — Other Ambulatory Visit: Payer: Self-pay

## 2022-01-04 ENCOUNTER — Ambulatory Visit (INDEPENDENT_AMBULATORY_CARE_PROVIDER_SITE_OTHER): Payer: Self-pay | Admitting: Infectious Disease

## 2022-01-04 ENCOUNTER — Encounter: Payer: Self-pay | Admitting: Infectious Disease

## 2022-01-04 VITALS — BP 162/92 | HR 71 | Temp 98.5°F | Ht 70.0 in | Wt 187.0 lb

## 2022-01-04 DIAGNOSIS — I10 Essential (primary) hypertension: Secondary | ICD-10-CM

## 2022-01-04 DIAGNOSIS — Z87891 Personal history of nicotine dependence: Secondary | ICD-10-CM

## 2022-01-04 DIAGNOSIS — M5441 Lumbago with sciatica, right side: Secondary | ICD-10-CM

## 2022-01-04 DIAGNOSIS — E785 Hyperlipidemia, unspecified: Secondary | ICD-10-CM

## 2022-01-04 DIAGNOSIS — G8929 Other chronic pain: Secondary | ICD-10-CM

## 2022-01-04 DIAGNOSIS — M5442 Lumbago with sciatica, left side: Secondary | ICD-10-CM

## 2022-01-04 DIAGNOSIS — B20 Human immunodeficiency virus [HIV] disease: Secondary | ICD-10-CM

## 2022-01-04 MED ORDER — PITAVASTATIN MAGNESIUM 4 MG PO TABS: 4.0000 mg | ORAL_TABLET | Freq: Every day | ORAL | 11 refills | Status: DC

## 2022-01-04 MED ORDER — GABAPENTIN 400 MG PO CAPS: 400.0000 mg | ORAL_CAPSULE | Freq: Three times a day (TID) | ORAL | 3 refills | Status: DC

## 2022-01-04 MED ORDER — DOVATO 50-300 MG PO TABS: 1.0000 | ORAL_TABLET | Freq: Every day | ORAL | 11 refills | Status: DC

## 2022-01-04 MED ORDER — DOVATO 50-300 MG PO TABS
1.0000 | ORAL_TABLET | Freq: Every day | ORAL | 11 refills | Status: DC
Start: 2022-01-04 — End: 2022-08-03

## 2022-01-04 MED ORDER — AMLODIPINE BESYLATE 10 MG PO TABS: 10.0000 mg | ORAL_TABLET | Freq: Every day | ORAL | 11 refills | Status: AC

## 2022-01-04 NOTE — Progress Notes (Signed)
Subjective:  Chief complaint: Back pain here also for follow-up for HIV disease on medications  Patient ID: Hayden Hicks, male    DOB: 09-15-1980, 41 y.o.   MRN: [id number]  HPI  Hayden Hicks is a 41 year-old Caucasian man living with HIV that is been perfectly controlled more recently on Dovato.  He has been followed in internal medicine clinic but has not seen him for many months.  Gabapentin was prescribed by them as well as amlodipine having been continued.  He was getting his medications filled at [facility] but now is lost insurance and having to enroll in the HIV medication assistance program.  He is having back pain that is worse now he is not on gabapentin and is asking for this to be refilled.    Past Medical History:  Diagnosis Date   Arthritis    Cryptosporidial gastroenteritis (HCC)    Elevated troponin    HIV infection (HCC)    Hypertension    Obesity 12/27/2020   Pericarditis     Past Surgical History:  Procedure Laterality Date   KNEE ARTHROSCOPY      No family history on file.    Social History   Socioeconomic History   Marital status: Media planner    Spouse name: Not on file   Number of children: Not on file   Years of education: Not on file   Highest education level: Not on file  Occupational History   Not on file  Tobacco Use   Smoking status: Former    Types: Cigarettes    Quit date: 10/27/2017    Years since quitting: 4.1   Smokeless tobacco: Never   Tobacco comments:    occ  Vaping Use   Vaping Use: Some days  Substance and Sexual Activity   Alcohol use: Yes    Comment: rarely   Drug use: Yes    Types: Marijuana   Sexual activity: Yes    Partners: Male    Comment: declined condoms  Other Topics Concern   Not on file  Social History Narrative   Not on file   Social Determinants of Health   Financial Resource Strain: Not on file  Food Insecurity: Not on file  Transportation Needs: Not on file  Physical Activity: Not  on file  Stress: Not on file  Social Connections: Not on file    No Known Allergies   Current Outpatient Medications:    amLODipine (NORVASC) 10 MG tablet, Take 1 tablet (10 mg total) by mouth daily., Disp: 30 tablet, Rfl: 11   dolutegravir-lamiVUDine (DOVATO) 50-300 MG tablet, Take 1 tablet by mouth daily., Disp: 30 tablet, Rfl: 11   gabapentin (NEURONTIN) 400 MG capsule, Take 1 capsule (400 mg total) by mouth 3 (three) times daily. Take 1 capsule (300 mg total) by mouth Three (3) times a day. For post-herpetic neuralgia: Take 1 tablet on day 1,  Then take 2 tablets on day 2, Then take 3 tablets on day 3 and every day after that as instructed by your doctor., Disp: 90 capsule, Rfl: 3   Pitavastatin Magnesium 4 MG TABS, Take 4 mg by mouth daily., Disp: 30 tablet, Rfl: 11   albuterol (VENTOLIN HFA) 108 (90 Base) MCG/ACT inhaler, Inhale 2 puffs into the lungs every 6 (six) hours as needed for wheezing or shortness of breath. (Patient not taking: Reported on 01/04/2022), Disp: 16 g, Rfl: 0   benzonatate (TESSALON) 200 MG capsule, Take 1 capsule (200 mg total) by  mouth 3 (three) times daily as needed for cough. (Patient not taking: Reported on 12/27/2020), Disp: 30 capsule, Rfl: 0   diclofenac (VOLTAREN) 50 MG EC tablet, TAKE 1 TABLET(50 MG) BY MOUTH THREE TIMES DAILY AS NEEDED FOR MILD PAIN OR MODERATE PAIN (Patient not taking: Reported on 12/27/2020), Disp: 63 tablet, Rfl: 0   diclofenac Sodium (VOLTAREN) 1 % GEL, Apply 4 g topically 4 (four) times daily. (Patient not taking: Reported on 12/27/2020), Disp: 150 g, Rfl: 2   meloxicam (MOBIC) 7.5 MG tablet, TAKE 1 TABLET(7.5 MG) BY MOUTH DAILY FOR 14 DAYS (Patient not taking: Reported on 12/27/2020), Disp: 14 tablet, Rfl: 0    Review of Systems  Constitutional:  Negative for activity change, appetite change, chills, diaphoresis, fatigue, fever and unexpected weight change.  HENT:  Negative for congestion, rhinorrhea, sinus pressure, sneezing, sore throat and  trouble swallowing.   Eyes:  Negative for photophobia and visual disturbance.  Respiratory:  Negative for cough, chest tightness, shortness of breath, wheezing and stridor.   Cardiovascular:  Negative for chest pain, palpitations and leg swelling.  Gastrointestinal:  Negative for abdominal distention, abdominal pain, anal bleeding, blood in stool, constipation, diarrhea, nausea and vomiting.  Genitourinary:  Negative for difficulty urinating, dysuria, flank pain and hematuria.  Musculoskeletal:  Negative for arthralgias, back pain, gait problem, joint swelling and myalgias.  Skin:  Negative for color change, pallor, rash and wound.  Neurological:  Negative for dizziness, tremors, weakness and light-headedness.  Hematological:  Negative for adenopathy. Does not bruise/bleed easily.  Psychiatric/Behavioral:  Negative for agitation, behavioral problems, confusion, decreased concentration, dysphoric mood and sleep disturbance.        Objective:   Physical Exam Constitutional:      Appearance: He is well-developed.  HENT:     Head: Normocephalic and atraumatic.  Eyes:     Conjunctiva/sclera: Conjunctivae normal.  Cardiovascular:     Rate and Rhythm: Normal rate and regular rhythm.  Pulmonary:     Effort: Pulmonary effort is normal. No respiratory distress.     Breath sounds: No wheezing.  Abdominal:     General: There is no distension.     Palpations: Abdomen is soft.  Musculoskeletal:        General: No tenderness. Normal range of motion.     Cervical back: Normal range of motion and neck supple.  Skin:    General: Skin is warm and dry.     Coloration: Skin is not pale.     Findings: No erythema or rash.  Neurological:     General: No focal deficit present.     Mental Status: He is alert and oriented to person, place, and time.  Psychiatric:        Mood and Affect: Mood normal.        Behavior: Behavior normal.        Thought Content: Thought content normal.        Judgment:  Judgment normal.     + Tinel's test      Assessment & Plan:   HIV disease clinic  Will recheck a viral load CD4 count CBC CMP  I have sent in Dovato to [facility] in [place]  STI screen we will screen for gonorrhea chlamydia genital next genital sites and check an RPR.  Back pain will refill his gabapentin would like PCP to do this in the future  Hypertension continuing amlodipine  CV risk mitigation: will send in Pitavastatin based on REPRIEVE study.

## 2022-01-04 NOTE — Patient Instructions (Addendum)
[facility], [address], [place], [place] [phone] [facility], [address] [place], [place], [phone] [facility], [address] [place], [place] [phone] [facility] (many locations): [email or web address] 

## 2022-01-05 ENCOUNTER — Other Ambulatory Visit (HOSPITAL_COMMUNITY): Payer: Self-pay

## 2022-01-05 LAB — T-HELPER CELLS (CD4) COUNT (NOT AT ARMC)
CD4 % Helper T Cell: 33 % (ref 33–65)
CD4 T Cell Abs: 850 /uL (ref 400–1790)

## 2022-01-06 LAB — LIPID PANEL
Cholesterol: 187 mg/dL (ref <200)
HDL: 54 mg/dL (ref >=40)
LDL Cholesterol (Calc): 108 mg/dL (calc) — ABNORMAL HIGH
Non-HDL Cholesterol (Calc): 133 mg/dL (calc) — ABNORMAL HIGH (ref <130)
Total CHOL/HDL Ratio: 3.5 (calc) (ref <5.0)
Triglycerides: 134 mg/dL (ref <150)

## 2022-01-06 LAB — CBC WITH DIFFERENTIAL/PLATELET
Absolute Monocytes: 460 cells/uL (ref 200–950)
Basophils Absolute: 30 cells/uL (ref 0–200)
Basophils Relative: 0.5 %
Eosinophils Absolute: 112 cells/uL (ref 15–500)
Eosinophils Relative: 1.9 %
HCT: 43.2 % (ref 38.5–50.0)
Hemoglobin: 15.6 g/dL (ref 13.2–17.1)
Lymphs Abs: 2502 cells/uL (ref 850–3900)
MCH: 33.1 pg — ABNORMAL HIGH (ref 27.0–33.0)
MCHC: 36.1 g/dL — ABNORMAL HIGH (ref 32.0–36.0)
MCV: 91.5 fL (ref 80.0–100.0)
MPV: 9.2 fL (ref 7.5–12.5)
Monocytes Relative: 7.8 %
Neutro Abs: 2797 cells/uL (ref 1500–7800)
Neutrophils Relative %: 47.4 %
Platelets: 236 10*3/uL (ref 140–400)
RBC: 4.72 10*6/uL (ref 4.20–5.80)
RDW: 12.7 % (ref 11.0–15.0)
Total Lymphocyte: 42.4 %
WBC: 5.9 10*3/uL (ref 3.8–10.8)

## 2022-01-06 LAB — URINE CYTOLOGY ANCILLARY ONLY
Chlamydia: NEGATIVE
Comment: NEGATIVE
Comment: NORMAL
Neisseria Gonorrhea: NEGATIVE

## 2022-01-06 LAB — COMPLETE METABOLIC PANEL WITH GFR
AG Ratio: 2 (calc) (ref 1.0–2.5)
ALT: 23 U/L (ref 9–46)
AST: 18 U/L (ref 10–40)
Albumin: 4.5 g/dL (ref 3.6–5.1)
Alkaline phosphatase (APISO): 62 U/L (ref 36–130)
BUN: 13 mg/dL (ref 7–25)
CO2: 30 mmol/L (ref 20–32)
Calcium: 9.2 mg/dL (ref 8.6–10.3)
Chloride: 106 mmol/L (ref 98–110)
Creat: 1.22 mg/dL (ref 0.60–1.29)
Globulin: 2.3 g/dL (calc) (ref 1.9–3.7)
Glucose, Bld: 90 mg/dL (ref 65–99)
Potassium: 3.9 mmol/L (ref 3.5–5.3)
Sodium: 141 mmol/L (ref 135–146)
Total Bilirubin: 0.7 mg/dL (ref 0.2–1.2)
Total Protein: 6.8 g/dL (ref 6.1–8.1)
eGFR: 76 mL/min/{1.73_m2} (ref >=60)

## 2022-01-06 LAB — HIV-1 RNA QUANT-NO REFLEX-BLD
HIV 1 RNA Quant: NOT DETECTED Copies/mL
HIV-1 RNA Quant, Log: NOT DETECTED Log cps/mL

## 2022-01-06 LAB — CYTOLOGY, (ORAL, ANAL, URETHRAL) ANCILLARY ONLY
Chlamydia: NEGATIVE
Chlamydia: NEGATIVE
Comment: NEGATIVE
Comment: NEGATIVE
Comment: NORMAL
Comment: NORMAL
Neisseria Gonorrhea: NEGATIVE
Neisseria Gonorrhea: NEGATIVE

## 2022-01-06 LAB — RPR: RPR Ser Ql: NONREACTIVE

## 2022-01-10 ENCOUNTER — Other Ambulatory Visit: Payer: Self-pay | Admitting: Pharmacist

## 2022-01-10 DIAGNOSIS — B20 Human immunodeficiency virus [HIV] disease: Secondary | ICD-10-CM

## 2022-01-10 MED ORDER — DOVATO 50-300 MG PO TABS: 1.0000 | ORAL_TABLET | Freq: Every day | ORAL | 0 refills | Status: AC

## 2022-01-10 NOTE — Progress Notes (Signed)
Medication Samples have been provided to the patient.  Drug name: Dovato        Strength: 50/300 mg         Qty: 42  Tablets (3 bottles) LOT: PP3C   Exp.Date: 11/24  Dosing instructions: Take one tablet by mouth once daily  The patient has been instructed regarding the correct time, dose, and frequency of taking this medication, including desired effects and most common side effects.   Margarite Gouge, PharmD, CPP, BCIDP Clinical Pharmacist Practitioner Infectious Diseases Clinical Pharmacist [facility]

## 2022-01-16 ENCOUNTER — Other Ambulatory Visit (HOSPITAL_COMMUNITY): Payer: Self-pay

## 2022-01-23 ENCOUNTER — Telehealth: Payer: Self-pay

## 2022-01-23 NOTE — Telephone Encounter (Signed)
Attempted to initiate PA for Pitvastatin through insurance. When call was placed PA representative stated patient was no longer covered under them. Spoke with Deanna, Financial Counselor who will try to set patient up with [employer].  E:[phone] Juanita Laster, RMA

## 2022-02-08 NOTE — Telephone Encounter (Signed)
Per Deanna, patient's insurance at work has not yet termed. Patient aware, and expressed to her that he would wait until it was no longer showing with [facility]. Currently waiting for [facility] to show insurance has termed before additional assistance can be provided.  Wyvonne Lenz, RN

## 2022-02-09 ENCOUNTER — Encounter: Payer: Self-pay | Admitting: Infectious Diseases

## 2022-02-22 ENCOUNTER — Other Ambulatory Visit: Payer: Self-pay

## 2022-02-22 ENCOUNTER — Ambulatory Visit (INDEPENDENT_AMBULATORY_CARE_PROVIDER_SITE_OTHER): Payer: Self-pay

## 2022-02-22 DIAGNOSIS — Z23 Encounter for immunization: Secondary | ICD-10-CM

## 2022-07-11 ENCOUNTER — Ambulatory Visit: Payer: Self-pay | Admitting: Infectious Diseases

## 2022-07-20 ENCOUNTER — Ambulatory Visit: Payer: Self-pay | Admitting: Internal Medicine

## 2022-08-03 ENCOUNTER — Other Ambulatory Visit: Payer: Self-pay

## 2022-08-03 ENCOUNTER — Ambulatory Visit (INDEPENDENT_AMBULATORY_CARE_PROVIDER_SITE_OTHER): Payer: Self-pay | Admitting: Family

## 2022-08-03 ENCOUNTER — Other Ambulatory Visit (HOSPITAL_COMMUNITY): Payer: Self-pay

## 2022-08-03 ENCOUNTER — Encounter: Payer: Self-pay | Admitting: Family

## 2022-08-03 VITALS — BP 165/126 | HR 88 | Temp 98.0°F | Wt 199.0 lb

## 2022-08-03 DIAGNOSIS — M5442 Lumbago with sciatica, left side: Secondary | ICD-10-CM

## 2022-08-03 DIAGNOSIS — G8929 Other chronic pain: Secondary | ICD-10-CM

## 2022-08-03 DIAGNOSIS — M5441 Lumbago with sciatica, right side: Secondary | ICD-10-CM

## 2022-08-03 DIAGNOSIS — I10 Essential (primary) hypertension: Secondary | ICD-10-CM

## 2022-08-03 DIAGNOSIS — B20 Human immunodeficiency virus [HIV] disease: Secondary | ICD-10-CM

## 2022-08-03 DIAGNOSIS — F17210 Nicotine dependence, cigarettes, uncomplicated: Secondary | ICD-10-CM

## 2022-08-03 DIAGNOSIS — Z9189 Other specified personal risk factors, not elsewhere classified: Secondary | ICD-10-CM

## 2022-08-03 MED ORDER — DOVATO 50-300 MG PO TABS: 1.0000 | ORAL_TABLET | Freq: Every day | ORAL | 6 refills | Status: DC

## 2022-08-03 MED ORDER — GABAPENTIN 400 MG PO CAPS: 400.0000 mg | ORAL_CAPSULE | Freq: Three times a day (TID) | ORAL | 1 refills | Status: DC

## 2022-08-03 NOTE — Patient Instructions (Addendum)
Nice to see you.  We will check your lab work today.  Continue to take your medication daily as prescribed.  Refills have been sent to the pharmacy.  Plan for follow up with Dr. Tommy Medal in 6 months or sooner if needed with lab work on the same day.  Have a great day and stay safe!

## 2022-08-03 NOTE — Progress Notes (Signed)
Brief Narrative   Patient ID: Hayden Hicks, male    DOB: [birth year], 41 y.o.   MRN: [id number]  Mr. Wernette is a 42 y/o male diagnosed with HIV disease in November 2008 with risk factor of MSM. Initial viral load and CD4 count are unavailable. No history of opportunistic infection. [id number] negative. ART experienced with Atripla, Triumeq and now Dovato.   Subjective:    Chief Complaint  Patient presents with   Follow-up    HPI:  Hayden Hicks is a 42 y.o. male with HIV disease last seen by Dr. Tommy Medal on 01/04/2022 with well-controlled virus and good adherence and tolerance to Dovato.  Viral load was undetectable with CD4 count of 850.  Renal function, liver function, electrolytes within normal ranges.  LDL 108, HDL 54, and triglycerides 134.  Was started on pitavastatin. Here today for follow up.  Seger has been doing okay since his last office visit and was initially covered by [employer] and now signed up for "government insurance."  Has not been able to get the pitavasttin. Continues to take Dovato with no adverse side effects. Feeling well today with concerns for medication coverage and needing refills. Condoms and STD testing offered. He is here today with his partner who is on PrEP.   Denies fevers, chills, night sweats, headaches, changes in vision, neck pain/stiffness, nausea, diarrhea, vomiting, lesions or rashes.    No Known Allergies    Outpatient Medications Prior to Visit  Medication Sig Dispense Refill   dolutegravir-lamiVUDine (DOVATO) 50-300 MG tablet Take 1 tablet by mouth daily. 30 tablet 11   albuterol (VENTOLIN HFA) 108 (90 Base) MCG/ACT inhaler Inhale 2 puffs into the lungs every 6 (six) hours as needed for wheezing or shortness of breath. (Patient not taking: Reported on 01/04/2022) 16 g 0   amLODipine (NORVASC) 10 MG tablet Take 1 tablet (10 mg total) by mouth daily. (Patient not taking: Reported on 08/03/2022) 30 tablet 11   benzonatate (TESSALON) 200 MG  capsule Take 1 capsule (200 mg total) by mouth 3 (three) times daily as needed for cough. (Patient not taking: Reported on 12/27/2020) 30 capsule 0   diclofenac (VOLTAREN) 50 MG EC tablet TAKE 1 TABLET(50 MG) BY MOUTH THREE TIMES DAILY AS NEEDED FOR MILD PAIN OR MODERATE PAIN (Patient not taking: Reported on 12/27/2020) 63 tablet 0   diclofenac Sodium (VOLTAREN) 1 % GEL Apply 4 g topically 4 (four) times daily. (Patient not taking: Reported on 12/27/2020) 150 g 2   meloxicam (MOBIC) 7.5 MG tablet TAKE 1 TABLET(7.5 MG) BY MOUTH DAILY FOR 14 DAYS (Patient not taking: Reported on 12/27/2020) 14 tablet 0   Pitavastatin Magnesium 4 MG TABS Take 4 mg by mouth daily. (Patient not taking: Reported on 08/03/2022) 30 tablet 11   gabapentin (NEURONTIN) 400 MG capsule Take 1 capsule (400 mg total) by mouth 3 (three) times daily. Take 1 capsule (300 mg total) by mouth Three (3) times a day. For post-herpetic neuralgia: Take 1 tablet on day 1,  Then take 2 tablets on day 2, Then take 3 tablets on day 3 and every day after that as instructed by your doctor. (Patient not taking: Reported on 08/03/2022) 90 capsule 3   No facility-administered medications prior to visit.     Past Medical History:  Diagnosis Date   Arthritis    Cryptosporidial gastroenteritis ([place])    Elevated troponin    HIV infection ([place])    Hypertension    Obesity 12/27/2020  Pericarditis      Past Surgical History:  Procedure Laterality Date   KNEE ARTHROSCOPY        Review of Systems  Constitutional:  Negative for appetite change, chills, fatigue, fever and unexpected weight change.  Eyes:  Negative for visual disturbance.  Respiratory:  Negative for cough, chest tightness, shortness of breath and wheezing.   Cardiovascular:  Negative for chest pain and leg swelling.  Gastrointestinal:  Negative for abdominal pain, constipation, diarrhea, nausea and vomiting.  Genitourinary:  Negative for dysuria, flank pain, frequency, genital sores,  hematuria and urgency.  Skin:  Negative for rash.  Allergic/Immunologic: Negative for immunocompromised state.  Neurological:  Negative for dizziness and headaches.      Objective:    BP (!) 165/126   Pulse 88   Temp 98 F (36.7 C) (Oral)   Wt 199 lb (90.3 kg)   BMI 28.55 kg/m  Nursing note and vital signs reviewed.  Physical Exam Constitutional:      General: He is not in acute distress.    Appearance: He is well-developed.  Eyes:     Conjunctiva/sclera: Conjunctivae normal.  Cardiovascular:     Rate and Rhythm: Normal rate and regular rhythm.     Heart sounds: Normal heart sounds. No murmur heard.    No friction rub. No gallop.  Pulmonary:     Effort: Pulmonary effort is normal. No respiratory distress.     Breath sounds: Normal breath sounds. No wheezing or rales.  Chest:     Chest wall: No tenderness.  Abdominal:     General: Bowel sounds are normal.     Palpations: Abdomen is soft.     Tenderness: There is no abdominal tenderness.  Musculoskeletal:     Cervical back: Neck supple.  Lymphadenopathy:     Cervical: No cervical adenopathy.  Skin:    General: Skin is warm and dry.     Findings: No rash.  Neurological:     Mental Status: He is alert and oriented to person, place, and time.  Psychiatric:        Mood and Affect: Mood is anxious.        Behavior: Behavior normal.        Thought Content: Thought content normal.        Judgment: Judgment normal.         01/04/2022    4:37 PM 05/13/2021   12:30 PM 12/27/2020    8:50 AM 02/19/2020    8:37 AM 01/29/2020    8:47 AM  Depression screen PHQ 2/9  Decreased Interest 0 0 0 0 0  Down, Depressed, Hopeless 1 0 0 0 0  PHQ - 2 Score 1 0 0 0 0  Altered sleeping  0     Tired, decreased energy  0     Change in appetite  0     Feeling bad or failure about yourself   0     Trouble concentrating  0     Moving slowly or fidgety/restless  0     Suicidal thoughts  0     PHQ-9 Score  0     Difficult doing  work/chores  Not difficult at all          Assessment & Plan:    Patient Active Problem List   Diagnosis Date Noted   At risk for cardiovascular event 08/04/2022   Back pain 05/16/2021   Obesity 12/27/2020   Tightness in chest 01/14/2020   Plantar fasciitis,  bilateral 10/28/2019   Cigarette smoker 02/12/2018   Cryptosporidial gastroenteritis ([place])    Pericarditis    Elevated troponin    Annual physical exam 08/23/2011   Polycythemia 02/20/2011   Bad breath 02/20/2011   Flu-like symptoms 02/20/2011   HTN (hypertension) 09/07/2010   Human immunodeficiency virus (HIV) disease ([place]) 08/15/2010   ADJ DISORDER WITH MIXED ANXIETY & DEPRESSED MOOD 08/15/2010   ARTHROSCOPY, KNEE, HX OF 08/15/2010     Problem List Items Addressed This Visit       Cardiovascular and Mediastinum   HTN (hypertension)    Blood pressure is poorly controlled as he has not been taking amlodipine. No neurological or ophthalmologic signs/symptoms. Should have plenty of refills at the pharmacy and encouraged to start taking amlodipine again. Educated that poorly controlled Stage 2 hypertension can lead to long term effects of cardiovascular disease, heart failure and renal disease. If blood pressure does not improve may need additional agent.         Other   Human immunodeficiency virus (HIV) disease ([place]) - Primary    Mr. Petroni continues to have well controlled virus with good adherence and tolerance to Dovato. Reviewed previous lab work and discussed plan of care. Applied for ICAP for financial assistance given his new insurance. Check lab work. Continue current dose of Dovato. Plan for follow up in 6 months or sooner if needed with lab work on the same day.       Relevant Medications   dolutegravir-lamiVUDine (DOVATO) 50-300 MG tablet   Other Relevant Orders   BASIC METABOLIC PANEL WITH GFR (Completed)   HIV-1 RNA quant-no reflex-bld   T-helper cell (CD4)- ([facility] clinic only)   Cigarette smoker     [place] continues to smoke tobacco. He is not ready to quit at this time. Reminded of risks associated with continued tobacco use which includes cardiovascular, respiratory and possible malignant disease.       Back pain    Currently on gabapentin for neuropathic pain and requesting refill of medication.  Working to establish with primary care. Will provide one additional refill of gabapentin for now.       Relevant Medications   gabapentin (NEURONTIN) 400 MG capsule   At risk for cardiovascular event    Previously prescribed pitavastatin and has not received the medication. It is not covered by new insurance plan and would need to start alterative agent like rosuvastatin. Declines medication today and he will discuss with Dr. Tommy Medal at follow up.         I am having Merric Yost. Corsello maintain his albuterol, benzonatate, meloxicam, diclofenac Sodium, diclofenac, amLODipine, Pitavastatin Magnesium, gabapentin, and Dovato.   Meds ordered this encounter  Medications   gabapentin (NEURONTIN) 400 MG capsule    Sig: Take 1 capsule (400 mg total) by mouth 3 (three) times daily. Take 1 capsule (300 mg total) by mouth Three (3) times a day. For post-herpetic neuralgia: Take 1 tablet on day 1,  Then take 2 tablets on day 2, Then take 3 tablets on day 3 and every day after that as instructed by your doctor.    Dispense:  90 capsule    Refill:  1    Order Specific Question:   Supervising Provider    Answer:   Carlyle Basques [[id number]]   dolutegravir-lamiVUDine (DOVATO) 50-300 MG tablet    Sig: Take 1 tablet by mouth daily.    Dispense:  30 tablet    Refill:  6    Order  Specific Question:   Supervising Provider    Answer:   Carlyle Basques [[id number]]     Follow-up: Return in about 6 months (around 02/03/2023), or if symptoms worsen or fail to improve.   Terri Piedra, MSN, FNP-C Nurse Practitioner [facility] [place] number: [phone]

## 2022-08-04 DIAGNOSIS — Z9189 Other specified personal risk factors, not elsewhere classified: Secondary | ICD-10-CM | POA: Insufficient documentation

## 2022-08-04 LAB — T-HELPER CELL (CD4) - (RCID CLINIC ONLY)
CD4 % Helper T Cell: 30 % — ABNORMAL LOW (ref 33–65)
CD4 T Cell Abs: 658 /uL (ref 400–1790)

## 2022-08-04 NOTE — Assessment & Plan Note (Signed)
Blood pressure is poorly controlled as he has not been taking amlodipine. No neurological or ophthalmologic signs/symptoms. Should have plenty of refills at the pharmacy and encouraged to start taking amlodipine again. Educated that poorly controlled Stage 2 hypertension can lead to long term effects of cardiovascular disease, heart failure and renal disease. If blood pressure does not improve may need additional agent.

## 2022-08-04 NOTE — Assessment & Plan Note (Signed)
Previously prescribed pitavastatin and has not received the medication. It is not covered by new insurance plan and would need to start alterative agent like rosuvastatin. Declines medication today and he will discuss with Dr. Tommy Medal at follow up.

## 2022-08-04 NOTE — Assessment & Plan Note (Signed)
Hayden Hicks continues to have well controlled virus with good adherence and tolerance to Dovato. Reviewed previous lab work and discussed plan of care. Applied for ICAP for financial assistance given his new insurance. Check lab work. Continue current dose of Dovato. Plan for follow up in 6 months or sooner if needed with lab work on the same day.

## 2022-08-04 NOTE — Assessment & Plan Note (Signed)
Hayden Hicks continues to smoke tobacco. He is not ready to quit at this time. Reminded of risks associated with continued tobacco use which includes cardiovascular, respiratory and possible malignant disease.

## 2022-08-04 NOTE — Assessment & Plan Note (Signed)
Currently on gabapentin for neuropathic pain and requesting refill of medication.  Working to establish with primary care. Will provide one additional refill of gabapentin for now.

## 2022-08-06 LAB — HIV-1 RNA QUANT-NO REFLEX-BLD
HIV 1 RNA Quant: NOT DETECTED Copies/mL
HIV-1 RNA Quant, Log: NOT DETECTED Log cps/mL

## 2022-08-06 LAB — BASIC METABOLIC PANEL WITH GFR
BUN: 15 mg/dL (ref 7–25)
CO2: 28 mmol/L (ref 20–32)
Calcium: 9.3 mg/dL (ref 8.6–10.3)
Chloride: 103 mmol/L (ref 98–110)
Creat: 1.09 mg/dL (ref 0.60–1.29)
Glucose, Bld: 95 mg/dL (ref 65–99)
Potassium: 4 mmol/L (ref 3.5–5.3)
Sodium: 140 mmol/L (ref 135–146)
eGFR: 87 mL/min/{1.73_m2} (ref >=60)

## 2022-09-28 ENCOUNTER — Other Ambulatory Visit: Payer: Self-pay | Admitting: Family

## 2022-09-28 DIAGNOSIS — G8929 Other chronic pain: Secondary | ICD-10-CM

## 2022-09-28 NOTE — Telephone Encounter (Signed)
Please advise if okay to refill. 

## 2022-12-26 ENCOUNTER — Other Ambulatory Visit: Payer: Self-pay

## 2022-12-26 DIAGNOSIS — G8929 Other chronic pain: Secondary | ICD-10-CM

## 2022-12-26 MED ORDER — GABAPENTIN 400 MG PO CAPS: 400.0000 mg | ORAL_CAPSULE | Freq: Three times a day (TID) | ORAL | 6 refills | Status: DC

## 2023-02-06 ENCOUNTER — Ambulatory Visit: Payer: Self-pay | Admitting: Infectious Disease

## 2023-02-12 ENCOUNTER — Encounter: Payer: Self-pay | Admitting: Infectious Disease

## 2023-02-12 DIAGNOSIS — B20 Human immunodeficiency virus [HIV] disease: Secondary | ICD-10-CM

## 2023-02-12 HISTORY — DX: Human immunodeficiency virus (HIV) disease: B20

## 2023-02-12 NOTE — Progress Notes (Deleted)
Subjective:  Chief complaint: Chief complaint follow-up for HIV disease on medications     Patient ID: Hayden Hicks, male    DOB: Jun 30, 1980, 42 y.o.   MRN: [id number]  HPI  Hayden Hicks is a 42 year-old Caucasian man living with HIV that is been perfectly controlled more recently on Dovato.  He does have comorbid hypertension, weight gain.  I had initiated him on a statin at least I thought I had based on thes REPRIEVE study  Past Medical History:  Diagnosis Date   Arthritis    Cryptosporidial gastroenteritis (HCC)    Elevated troponin    HIV infection (HCC)    Hypertension    Obesity 12/27/2020   Pericarditis     Past Surgical History:  Procedure Laterality Date   KNEE ARTHROSCOPY      No family history on file.    Social History   Socioeconomic History   Marital status: Media planner    Spouse name: Not on file   Number of children: Not on file   Years of education: Not on file   Highest education level: Not on file  Occupational History   Not on file  Tobacco Use   Smoking status: Former    Current packs/day: 0.00    Types: Cigarettes    Quit date: 10/27/2017    Years since quitting: 5.2   Smokeless tobacco: Never   Tobacco comments:    occ  Vaping Use   Vaping status: Some Days  Substance and Sexual Activity   Alcohol use: Yes    Comment: rarely   Drug use: Yes    Types: Marijuana   Sexual activity: Yes    Partners: Male    Comment: declined condoms  Other Topics Concern   Not on file  Social History Narrative   Not on file   Social Determinants of Health   Financial Resource Strain: Not on file  Food Insecurity: Not on file  Transportation Needs: Not on file  Physical Activity: Not on file  Stress: Not on file  Social Connections: Not on file    No Known Allergies   Current Outpatient Medications:    albuterol (VENTOLIN HFA) 108 (90 Base) MCG/ACT inhaler, Inhale 2 puffs into the lungs every 6 (six) hours as needed for wheezing or  shortness of breath. (Patient not taking: Reported on 01/04/2022), Disp: 16 g, Rfl: 0   amLODipine (NORVASC) 10 MG tablet, Take 1 tablet (10 mg total) by mouth daily. (Patient not taking: Reported on 08/03/2022), Disp: 30 tablet, Rfl: 11   benzonatate (TESSALON) 200 MG capsule, Take 1 capsule (200 mg total) by mouth 3 (three) times daily as needed for cough. (Patient not taking: Reported on 12/27/2020), Disp: 30 capsule, Rfl: 0   diclofenac (VOLTAREN) 50 MG EC tablet, TAKE 1 TABLET(50 MG) BY MOUTH THREE TIMES DAILY AS NEEDED FOR MILD PAIN OR MODERATE PAIN (Patient not taking: Reported on 12/27/2020), Disp: 63 tablet, Rfl: 0   diclofenac Sodium (VOLTAREN) 1 % GEL, Apply 4 g topically 4 (four) times daily. (Patient not taking: Reported on 12/27/2020), Disp: 150 g, Rfl: 2   dolutegravir-lamiVUDine (DOVATO) 50-300 MG tablet, Take 1 tablet by mouth daily., Disp: 30 tablet, Rfl: 6   gabapentin (NEURONTIN) 400 MG capsule, Take 1 capsule (400 mg total) by mouth 3 (three) times daily., Disp: 90 capsule, Rfl: 6   meloxicam (MOBIC) 7.5 MG tablet, TAKE 1 TABLET(7.5 MG) BY MOUTH DAILY FOR 14 DAYS (Patient not taking: Reported on 12/27/2020), Disp:  14 tablet, Rfl: 0   Pitavastatin Magnesium 4 MG TABS, Take 4 mg by mouth daily. (Patient not taking: Reported on 08/03/2022), Disp: 30 tablet, Rfl: 11    Review of Systems     Objective:   Physical Exam        Assessment & Plan:   HIV disease:  I will add order HIV viral load CD4 count CBC with differential CMP, RPR GC and chlamydia and I will continue  Hayden Hicks's Dovato prescription  Hyperlipidemia: Will try to rechallenge him with statin covered by his insurance will try Lipitor.  Will also check lipid panel today hypertension: Continue on:  Vaccine counseling recommended updated flu and COVID-19 vaccination

## 2023-02-13 ENCOUNTER — Ambulatory Visit: Payer: Self-pay | Admitting: Infectious Disease

## 2023-02-13 ENCOUNTER — Telehealth: Payer: Self-pay

## 2023-02-13 DIAGNOSIS — I10 Essential (primary) hypertension: Secondary | ICD-10-CM

## 2023-02-13 DIAGNOSIS — B20 Human immunodeficiency virus [HIV] disease: Secondary | ICD-10-CM

## 2023-02-13 DIAGNOSIS — D751 Secondary polycythemia: Secondary | ICD-10-CM

## 2023-02-13 DIAGNOSIS — F17210 Nicotine dependence, cigarettes, uncomplicated: Secondary | ICD-10-CM

## 2023-02-13 NOTE — Telephone Encounter (Signed)
Received call from [facility] requesting refill of amlodipine. Called Hayden Hicks to discuss as this medication was marked "not taking" at his last office visit.   Hayden Hicks states he now has a PCP in [facility], Dr. Lanae Boast, who is managing his BP. States Dr. Lanae Boast wanted to take him off the amlodipine and try something else, but Hayden Hicks does not want to keep switching medications and would like a refill of the amlodipine.   Discussed that if Dr. Lanae Boast is managing his BP, it would be best to follow his recommendation and that Dr. Daiva Eves would probably not want to interfere in his treatment plan.   Sandie Ano, RN

## 2023-02-14 ENCOUNTER — Other Ambulatory Visit: Payer: Self-pay | Admitting: Family

## 2023-02-15 NOTE — Telephone Encounter (Signed)
[facility] called office back to follow up on refill request. Informed pharmacist that patient has a PCP who has been managing his BP. Requested they call PCP to discuss refill due to discussion of medication change. Juanita Laster, RMA

## 2023-02-27 ENCOUNTER — Encounter: Payer: Self-pay | Admitting: Infectious Disease

## 2023-02-27 DIAGNOSIS — E785 Hyperlipidemia, unspecified: Secondary | ICD-10-CM

## 2023-02-27 HISTORY — DX: Hyperlipidemia, unspecified: E78.5

## 2023-02-27 NOTE — Progress Notes (Unsigned)
Subjective:  Chief complaint: Complaint follow-up for HIV disease on medications    Patient ID: Hayden Hicks, male    DOB: 29-Jan-1981, 42 y.o.   MRN: [id number]  HPI  Hayden Hicks is a 42 year-old Caucasian man living with HIV that is been perfectly controlled more recently on Dovato.  He does have comorbid hypertension, weight gain.  And based on the reprieve study hyperlipidemia  I  Past Medical History:  Diagnosis Date   Arthritis    Cryptosporidial gastroenteritis (HCC)    Elevated troponin    HIV disease (HCC) 02/12/2023   HIV infection (HCC)    Hypertension    Obesity 12/27/2020   Pericarditis     Past Surgical History:  Procedure Laterality Date   KNEE ARTHROSCOPY      No family history on file.    Social History   Socioeconomic History   Marital status: Media planner    Spouse name: Not on file   Number of children: Not on file   Years of education: Not on file   Highest education level: Not on file  Occupational History   Not on file  Tobacco Use   Smoking status: Former    Current packs/day: 0.00    Types: Cigarettes    Quit date: 10/27/2017    Years since quitting: 5.3   Smokeless tobacco: Never   Tobacco comments:    occ  Vaping Use   Vaping status: Some Days  Substance and Sexual Activity   Alcohol use: Yes    Comment: rarely   Drug use: Yes    Types: Marijuana   Sexual activity: Yes    Partners: Male    Comment: declined condoms  Other Topics Concern   Not on file  Social History Narrative   Not on file   Social Determinants of Health   Financial Resource Strain: Not on file  Food Insecurity: Not on file  Transportation Needs: Not on file  Physical Activity: Not on file  Stress: Not on file  Social Connections: Not on file    No Known Allergies   Current Outpatient Medications:    albuterol (VENTOLIN HFA) 108 (90 Base) MCG/ACT inhaler, Inhale 2 puffs into the lungs every 6 (six) hours as needed for wheezing or shortness of  breath. (Patient not taking: Reported on 01/04/2022), Disp: 16 g, Rfl: 0   amLODipine (NORVASC) 10 MG tablet, Take 1 tablet (10 mg total) by mouth daily. (Patient not taking: Reported on 08/03/2022), Disp: 30 tablet, Rfl: 11   benzonatate (TESSALON) 200 MG capsule, Take 1 capsule (200 mg total) by mouth 3 (three) times daily as needed for cough. (Patient not taking: Reported on 12/27/2020), Disp: 30 capsule, Rfl: 0   diclofenac (VOLTAREN) 50 MG EC tablet, TAKE 1 TABLET(50 MG) BY MOUTH THREE TIMES DAILY AS NEEDED FOR MILD PAIN OR MODERATE PAIN (Patient not taking: Reported on 12/27/2020), Disp: 63 tablet, Rfl: 0   diclofenac Sodium (VOLTAREN) 1 % GEL, Apply 4 g topically 4 (four) times daily. (Patient not taking: Reported on 12/27/2020), Disp: 150 g, Rfl: 2   dolutegravir-lamiVUDine (DOVATO) 50-300 MG tablet, TAKE 1 TABLET BY MOUTH DAILY, Disp: 30 tablet, Rfl: 5   gabapentin (NEURONTIN) 400 MG capsule, Take 1 capsule (400 mg total) by mouth 3 (three) times daily., Disp: 90 capsule, Rfl: 6   meloxicam (MOBIC) 7.5 MG tablet, TAKE 1 TABLET(7.5 MG) BY MOUTH DAILY FOR 14 DAYS (Patient not taking: Reported on 12/27/2020), Disp: 14 tablet, Rfl: 0  Pitavastatin Magnesium 4 MG TABS, Take 4 mg by mouth daily. (Patient not taking: Reported on 08/03/2022), Disp: 30 tablet, Rfl: 11    Review of Systems  Constitutional:  Negative for activity change, appetite change, chills, diaphoresis, fatigue, fever and unexpected weight change.  HENT:  Negative for congestion, rhinorrhea, sinus pressure, sneezing, sore throat and trouble swallowing.   Eyes:  Negative for photophobia and visual disturbance.  Respiratory:  Negative for cough, chest tightness, shortness of breath, wheezing and stridor.   Cardiovascular:  Negative for chest pain, palpitations and leg swelling.  Gastrointestinal:  Negative for abdominal distention, abdominal pain, anal bleeding, blood in stool, constipation, diarrhea, nausea and vomiting.  Genitourinary:   Negative for difficulty urinating, dysuria, flank pain and hematuria.  Musculoskeletal:  Negative for arthralgias, back pain, gait problem, joint swelling and myalgias.  Skin:  Negative for color change, pallor, rash and wound.  Neurological:  Negative for dizziness, tremors, weakness and light-headedness.  Hematological:  Negative for adenopathy. Does not bruise/bleed easily.  Psychiatric/Behavioral:  Negative for agitation, behavioral problems, confusion, decreased concentration, dysphoric mood and sleep disturbance.        Objective:   Physical Exam Constitutional:      Appearance: He is well-developed.  HENT:     Head: Normocephalic and atraumatic.  Eyes:     Conjunctiva/sclera: Conjunctivae normal.  Cardiovascular:     Rate and Rhythm: Normal rate and regular rhythm.  Pulmonary:     Effort: Pulmonary effort is normal. No respiratory distress.     Breath sounds: No wheezing.  Abdominal:     General: There is no distension.     Palpations: Abdomen is soft.  Musculoskeletal:        General: No tenderness. Normal range of motion.     Cervical back: Normal range of motion and neck supple.  Skin:    General: Skin is warm and dry.     Coloration: Skin is not pale.     Findings: No erythema or rash.  Neurological:     General: No focal deficit present.     Mental Status: He is alert and oriented to person, place, and time.  Psychiatric:        Mood and Affect: Mood normal.        Behavior: Behavior normal.        Thought Content: Thought content normal.        Judgment: Judgment normal.           Assessment & Plan:   HIV disease:  I will add order HIV viral load CD4 count CBC with differential CMP, RPR GC and chlamydia and I will continue  Hayden Hicks's  Dovato prescription  Hyperlipidemia I would like to restart him on a statin I can prescribe atorvastatin and see if that is covered  Hypertension:  Cancer prevention offering anal Pap smear but he  declined   Vaccine counseling recommended updated flu and COVID-19 vaccination which he received

## 2023-02-28 ENCOUNTER — Ambulatory Visit: Payer: No Typology Code available for payment source

## 2023-02-28 ENCOUNTER — Ambulatory Visit (INDEPENDENT_AMBULATORY_CARE_PROVIDER_SITE_OTHER): Payer: Self-pay | Admitting: Infectious Disease

## 2023-02-28 ENCOUNTER — Encounter: Payer: Self-pay | Admitting: Infectious Disease

## 2023-02-28 ENCOUNTER — Other Ambulatory Visit: Payer: Self-pay

## 2023-02-28 VITALS — BP 132/92 | HR 80 | Resp 16 | Ht 70.0 in | Wt 199.0 lb

## 2023-02-28 DIAGNOSIS — Z23 Encounter for immunization: Secondary | ICD-10-CM

## 2023-02-28 DIAGNOSIS — B20 Human immunodeficiency virus [HIV] disease: Secondary | ICD-10-CM

## 2023-02-28 DIAGNOSIS — F17210 Nicotine dependence, cigarettes, uncomplicated: Secondary | ICD-10-CM

## 2023-02-28 DIAGNOSIS — E785 Hyperlipidemia, unspecified: Secondary | ICD-10-CM

## 2023-02-28 DIAGNOSIS — I10 Essential (primary) hypertension: Secondary | ICD-10-CM

## 2023-02-28 MED ORDER — DOVATO 50-300 MG PO TABS: 1.0000 | ORAL_TABLET | Freq: Every day | ORAL | 11 refills | Status: DC

## 2023-02-28 MED ORDER — ATORVASTATIN CALCIUM 20 MG PO TABS: 20.0000 mg | ORAL_TABLET | Freq: Every day | ORAL | 11 refills | Status: DC

## 2023-03-02 LAB — COMPLETE METABOLIC PANEL WITH GFR
AG Ratio: 1.8 (calc) (ref 1.0–2.5)
ALT: 25 U/L (ref 9–46)
AST: 16 U/L (ref 10–40)
Albumin: 4.6 g/dL (ref 3.6–5.1)
Alkaline phosphatase (APISO): 80 U/L (ref 36–130)
BUN: 10 mg/dL (ref 7–25)
CO2: 28 mmol/L (ref 20–32)
Calcium: 10.1 mg/dL (ref 8.6–10.3)
Chloride: 101 mmol/L (ref 98–110)
Creat: 1.19 mg/dL (ref 0.60–1.29)
Globulin: 2.5 g/dL (ref 1.9–3.7)
Glucose, Bld: 107 mg/dL — ABNORMAL HIGH (ref 65–99)
Potassium: 4.7 mmol/L (ref 3.5–5.3)
Sodium: 138 mmol/L (ref 135–146)
Total Bilirubin: 0.6 mg/dL (ref 0.2–1.2)
Total Protein: 7.1 g/dL (ref 6.1–8.1)
eGFR: 78 mL/min/{1.73_m2} (ref >=60)

## 2023-03-02 LAB — CBC WITH DIFFERENTIAL/PLATELET
Absolute Monocytes: 391 {cells}/uL (ref 200–950)
Basophils Absolute: 50 {cells}/uL (ref 0–200)
Basophils Relative: 0.8 %
Eosinophils Absolute: 112 {cells}/uL (ref 15–500)
Eosinophils Relative: 1.8 %
HCT: 51.3 % — ABNORMAL HIGH (ref 38.5–50.0)
Hemoglobin: 17.6 g/dL — ABNORMAL HIGH (ref 13.2–17.1)
Lymphs Abs: 2046 {cells}/uL (ref 850–3900)
MCH: 33.1 pg — ABNORMAL HIGH (ref 27.0–33.0)
MCHC: 34.3 g/dL (ref 32.0–36.0)
MCV: 96.4 fL (ref 80.0–100.0)
MPV: 9.1 fL (ref 7.5–12.5)
Monocytes Relative: 6.3 %
Neutro Abs: 3602 {cells}/uL (ref 1500–7800)
Neutrophils Relative %: 58.1 %
Platelets: 283 10*3/uL (ref 140–400)
RBC: 5.32 10*6/uL (ref 4.20–5.80)
RDW: 12.4 % (ref 11.0–15.0)
Total Lymphocyte: 33 %
WBC: 6.2 10*3/uL (ref 3.8–10.8)

## 2023-03-02 LAB — HIV-1 RNA QUANT-NO REFLEX-BLD
HIV 1 RNA Quant: NOT DETECTED {copies}/mL
HIV-1 RNA Quant, Log: NOT DETECTED {Log}

## 2023-03-02 LAB — LIPID PANEL
Cholesterol: 210 mg/dL — ABNORMAL HIGH (ref <200)
HDL: 54 mg/dL (ref >=40)
LDL Cholesterol (Calc): 130 mg/dL — ABNORMAL HIGH
Non-HDL Cholesterol (Calc): 156 mg/dL — ABNORMAL HIGH (ref <130)
Total CHOL/HDL Ratio: 3.9 (calc) (ref <5.0)
Triglycerides: 148 mg/dL (ref <150)

## 2023-03-02 LAB — T-HELPER CELLS (CD4) COUNT (NOT AT ARMC)
CD4 % Helper T Cell: 26 % — ABNORMAL LOW (ref 33–65)
CD4 T Cell Abs: 478 /uL (ref 400–1790)

## 2023-03-02 LAB — RPR: RPR Ser Ql: NONREACTIVE

## 2023-07-23 ENCOUNTER — Other Ambulatory Visit: Payer: Self-pay | Admitting: Infectious Disease

## 2023-07-23 DIAGNOSIS — G8929 Other chronic pain: Secondary | ICD-10-CM

## 2023-07-23 NOTE — Telephone Encounter (Signed)
 Okay to refill?

## 2023-10-09 NOTE — Progress Notes (Signed)
 The 10-year ASCVD risk score (Arnett DK, et al., 2019) is: 2%   Values used to calculate the score:     Age: 43 years     Sex: Male     Is Non-Hispanic African American: No     Diabetic: No     Tobacco smoker: No     Systolic Blood Pressure: 132 mmHg     Is BP treated: Yes     HDL Cholesterol: 54 mg/dL     Total Cholesterol: 210 mg/dL  Arlon Bergamo, BSN, RN

## 2023-12-09 ENCOUNTER — Encounter: Payer: Self-pay | Admitting: Infectious Disease

## 2023-12-09 DIAGNOSIS — Z7185 Encounter for immunization safety counseling: Secondary | ICD-10-CM | POA: Insufficient documentation

## 2023-12-09 NOTE — Progress Notes (Unsigned)
 Chief complaint: follow-up for HIV disease on medications  Subjective:    Patient ID: Hayden Hicks, male    DOB: 01-08-1981, 43 y.o.   MRN: [id number]  HPI  Discussed the use of AI scribe software for clinical note transcription with the patient, who gave verbal consent to proceed.  History of Present Illness   Hayden Hicks is a 43 year old male with HIV who presents for a follow-up visit.  He is on Dovato  for HIV management with good tolerance. His viral load is undetectable, and his CD4 count is 478. when last checked. He has no new symptoms or issues related to his HIV treatment. He agreed to STI testing, including a swab for gonorrhea and chlamydia, and discussed the possibility of an anal Pap smear for rectal cancer screening.       Past Medical History:  Diagnosis Date   Arthritis    Cryptosporidial gastroenteritis (HCC)    Elevated troponin    HIV disease (HCC) 02/12/2023   HIV infection (HCC)    Hyperlipidemia 02/27/2023   Hypertension    Obesity 12/27/2020   Pericarditis    Vaccine counseling 12/09/2023    Past Surgical History:  Procedure Laterality Date   KNEE ARTHROSCOPY      No family history on file.    Social History   Socioeconomic History   Marital status: Media planner    Spouse name: Not on file   Number of children: Not on file   Years of education: Not on file   Highest education level: Not on file  Occupational History   Not on file  Tobacco Use   Smoking status: Former    Current packs/day: 0.00    Types: Cigarettes    Quit date: 10/27/2017    Years since quitting: 6.1   Smokeless tobacco: Never   Tobacco comments:    occ  Vaping Use   Vaping status: Some Days  Substance and Sexual Activity   Alcohol use: Yes    Comment: rarely   Drug use: Yes    Types: Marijuana   Sexual activity: Yes    Partners: Male    Comment: declined condoms  Other Topics Concern   Not on file  Social History Narrative   Not on file    Social Drivers of Health   Financial Resource Strain: Not on file  Food Insecurity: Not on file  Transportation Needs: Not on file  Physical Activity: Not on file  Stress: Not on file  Social Connections: Not on file    No Known Allergies   Current Outpatient Medications:    albuterol  (VENTOLIN  HFA) 108 (90 Base) MCG/ACT inhaler, Inhale 2 puffs into the lungs every 6 (six) hours as needed for wheezing or shortness of breath. (Patient not taking: Reported on 01/04/2022), Disp: 16 g, Rfl: 0   amLODipine  (NORVASC ) 10 MG tablet, Take 1 tablet (10 mg total) by mouth daily. (Patient not taking: Reported on 08/03/2022), Disp: 30 tablet, Rfl: 11   atorvastatin  (LIPITOR) 20 MG tablet, Take 1 tablet (20 mg total) by mouth daily., Disp: 30 tablet, Rfl: 11   benzonatate  (TESSALON ) 200 MG capsule, Take 1 capsule (200 mg total) by mouth 3 (three) times daily as needed for cough. (Patient not taking: Reported on 12/27/2020), Disp: 30 capsule, Rfl: 0   diclofenac  (VOLTAREN ) 50 MG EC tablet, TAKE 1 TABLET(50 MG) BY MOUTH THREE TIMES DAILY AS NEEDED FOR MILD PAIN OR MODERATE PAIN (Patient not taking: Reported on  12/27/2020), Disp: 63 tablet, Rfl: 0   diclofenac  Sodium (VOLTAREN ) 1 % GEL, Apply 4 g topically 4 (four) times daily. (Patient not taking: Reported on 12/27/2020), Disp: 150 g, Rfl: 2   dolutegravir -lamiVUDine  (DOVATO ) 50-300 MG tablet, Take 1 tablet by mouth daily., Disp: 30 tablet, Rfl: 11   gabapentin  (NEURONTIN ) 400 MG capsule, TAKE 1 CAPSULE(400 MG) BY MOUTH THREE TIMES DAILY, Disp: 90 capsule, Rfl: 6   meloxicam  (MOBIC ) 7.5 MG tablet, TAKE 1 TABLET(7.5 MG) BY MOUTH DAILY FOR 14 DAYS (Patient not taking: Reported on 12/27/2020), Disp: 14 tablet, Rfl: 0   Review of Systems  Constitutional:  Negative for activity change, appetite change, chills, diaphoresis, fatigue, fever and unexpected weight change.  HENT:  Negative for congestion, rhinorrhea, sinus pressure, sneezing, sore throat and trouble  swallowing.   Eyes:  Negative for photophobia and visual disturbance.  Respiratory:  Negative for cough, chest tightness, shortness of breath, wheezing and stridor.   Cardiovascular:  Negative for chest pain, palpitations and leg swelling.  Gastrointestinal:  Negative for abdominal distention, abdominal pain, anal bleeding, blood in stool, constipation, diarrhea, nausea and vomiting.  Genitourinary:  Negative for difficulty urinating, dysuria, flank pain and hematuria.  Musculoskeletal:  Negative for arthralgias, back pain, gait problem, joint swelling and myalgias.  Skin:  Negative for color change, pallor, rash and wound.  Neurological:  Negative for dizziness, tremors, weakness and light-headedness.  Hematological:  Negative for adenopathy. Does not bruise/bleed easily.  Psychiatric/Behavioral:  Negative for agitation, behavioral problems, confusion, decreased concentration, dysphoric mood and sleep disturbance.        Objective:   Physical Exam Constitutional:      Appearance: He is well-developed.  HENT:     Head: Normocephalic and atraumatic.  Eyes:     Conjunctiva/sclera: Conjunctivae normal.  Cardiovascular:     Rate and Rhythm: Normal rate and regular rhythm.  Pulmonary:     Effort: Pulmonary effort is normal. No respiratory distress.     Breath sounds: No wheezing.  Abdominal:     General: There is no distension.     Palpations: Abdomen is soft.  Musculoskeletal:        General: No tenderness. Normal range of motion.     Cervical back: Normal range of motion and neck supple.  Skin:    General: Skin is warm and dry.     Coloration: Skin is not pale.     Findings: No erythema or rash.  Neurological:     General: No focal deficit present.     Mental Status: He is alert and oriented to person, place, and time.  Psychiatric:        Mood and Affect: Mood normal.        Behavior: Behavior normal.        Thought Content: Thought content normal.        Judgment: Judgment  normal.           Assessment & Plan:   Assessment and Plan    HIV infection HIV well-controlled with undetectable viral load, CD4 count 478. Tolerating Dovato . - Continue Dovato . - Order routine labs for viral load and CD4 count. SABRA STI screening: he was offered site specific STI testing and this was done at OP, rectum and urine for GC CHL and RPR in blood Rectal cancer screening Discussed anal Pap smear for early detection of rectal cancer. Screening decision left to him. - Perform anal Pap smear. - Refer to Dr. Debby if results abnormal.  Hyperlipidemia  Managed with atorvastatin , well-tolerated. Supported by reprieve study for patients over 40 with HIV. - Continue atorvastatin .  Tobacco use no longer smokes though his partner does   HTN: continue amlodipine      Vaccine counseling: recommended and he received Prevnar 20

## 2023-12-10 ENCOUNTER — Other Ambulatory Visit: Payer: Self-pay

## 2023-12-10 ENCOUNTER — Ambulatory Visit: Payer: Self-pay

## 2023-12-10 ENCOUNTER — Encounter: Payer: Self-pay | Admitting: Infectious Disease

## 2023-12-10 ENCOUNTER — Ambulatory Visit: Payer: Self-pay | Admitting: Infectious Disease

## 2023-12-10 VITALS — BP 126/87 | HR 65 | Temp 98.4°F | Wt 201.0 lb

## 2023-12-10 DIAGNOSIS — B20 Human immunodeficiency virus [HIV] disease: Secondary | ICD-10-CM

## 2023-12-10 DIAGNOSIS — F17210 Nicotine dependence, cigarettes, uncomplicated: Secondary | ICD-10-CM

## 2023-12-10 DIAGNOSIS — Z23 Encounter for immunization: Secondary | ICD-10-CM

## 2023-12-10 DIAGNOSIS — Z1212 Encounter for screening for malignant neoplasm of rectum: Secondary | ICD-10-CM

## 2023-12-10 DIAGNOSIS — I10 Essential (primary) hypertension: Secondary | ICD-10-CM

## 2023-12-10 DIAGNOSIS — E785 Hyperlipidemia, unspecified: Secondary | ICD-10-CM

## 2023-12-10 DIAGNOSIS — Z7185 Encounter for immunization safety counseling: Secondary | ICD-10-CM

## 2023-12-11 LAB — URINE CYTOLOGY ANCILLARY ONLY
Chlamydia: NEGATIVE
Comment: NEGATIVE
Comment: NORMAL
Neisseria Gonorrhea: NEGATIVE

## 2023-12-11 LAB — CYTOLOGY, (ORAL, ANAL, URETHRAL) ANCILLARY ONLY
Chlamydia: NEGATIVE
Chlamydia: NEGATIVE
Comment: NEGATIVE
Comment: NEGATIVE
Comment: NORMAL
Comment: NORMAL
Neisseria Gonorrhea: NEGATIVE
Neisseria Gonorrhea: NEGATIVE

## 2023-12-11 LAB — T-HELPER CELLS (CD4) COUNT (NOT AT ARMC)
CD4 % Helper T Cell: 32 % — ABNORMAL LOW (ref 33–65)
CD4 T Cell Abs: 842 /uL (ref 400–1790)

## 2023-12-12 LAB — COMPLETE METABOLIC PANEL WITHOUT GFR
AG Ratio: 2.3 (calc) (ref 1.0–2.5)
ALT: 27 U/L (ref 9–46)
AST: 19 U/L (ref 10–40)
Albumin: 4.8 g/dL (ref 3.6–5.1)
Alkaline phosphatase (APISO): 78 U/L (ref 36–130)
BUN: 9 mg/dL (ref 7–25)
CO2: 30 mmol/L (ref 20–32)
Calcium: 9.6 mg/dL (ref 8.6–10.3)
Chloride: 103 mmol/L (ref 98–110)
Creat: 1.21 mg/dL (ref 0.60–1.29)
Globulin: 2.1 g/dL (ref 1.9–3.7)
Glucose, Bld: 94 mg/dL (ref 65–99)
Potassium: 4 mmol/L (ref 3.5–5.3)
Sodium: 141 mmol/L (ref 135–146)
Total Bilirubin: 1.2 mg/dL (ref 0.2–1.2)
Total Protein: 6.9 g/dL (ref 6.1–8.1)

## 2023-12-12 LAB — CBC WITH DIFFERENTIAL/PLATELET
Absolute Lymphocytes: 2574 {cells}/uL (ref 850–3900)
Absolute Monocytes: 427 {cells}/uL (ref 200–950)
Basophils Absolute: 43 {cells}/uL (ref 0–200)
Basophils Relative: 0.7 %
Eosinophils Absolute: 110 {cells}/uL (ref 15–500)
Eosinophils Relative: 1.8 %
HCT: 45.8 % (ref 38.5–50.0)
Hemoglobin: 15.7 g/dL (ref 13.2–17.1)
MCH: 32.2 pg (ref 27.0–33.0)
MCHC: 34.3 g/dL (ref 32.0–36.0)
MCV: 94 fL (ref 80.0–100.0)
MPV: 9.4 fL (ref 7.5–12.5)
Monocytes Relative: 7 %
Neutro Abs: 2946 {cells}/uL (ref 1500–7800)
Neutrophils Relative %: 48.3 %
Platelets: 245 Thousand/uL (ref 140–400)
RBC: 4.87 Million/uL (ref 4.20–5.80)
RDW: 12.7 % (ref 11.0–15.0)
Total Lymphocyte: 42.2 %
WBC: 6.1 Thousand/uL (ref 3.8–10.8)

## 2023-12-12 LAB — LIPID PANEL
Cholesterol: 126 mg/dL (ref <200)
HDL: 40 mg/dL (ref >=40)
LDL Cholesterol (Calc): 65 mg/dL
Non-HDL Cholesterol (Calc): 86 mg/dL (ref <130)
Total CHOL/HDL Ratio: 3.2 (calc) (ref <5.0)
Triglycerides: 129 mg/dL (ref <150)

## 2023-12-12 LAB — HIV-1 RNA QUANT-NO REFLEX-BLD
HIV 1 RNA Quant: NOT DETECTED {copies}/mL
HIV-1 RNA Quant, Log: NOT DETECTED {Log_copies}/mL

## 2023-12-12 LAB — RPR: RPR Ser Ql: NONREACTIVE

## 2023-12-17 LAB — CYTOLOGY - PAP: Diagnosis: NEGATIVE

## 2024-01-29 ENCOUNTER — Other Ambulatory Visit: Payer: Self-pay | Admitting: Infectious Disease

## 2024-01-29 DIAGNOSIS — G8929 Other chronic pain: Secondary | ICD-10-CM

## 2024-03-05 ENCOUNTER — Other Ambulatory Visit: Payer: Self-pay

## 2024-03-05 ENCOUNTER — Ambulatory Visit (INDEPENDENT_AMBULATORY_CARE_PROVIDER_SITE_OTHER): Admitting: Pharmacist

## 2024-03-05 DIAGNOSIS — Z23 Encounter for immunization: Secondary | ICD-10-CM

## 2024-03-05 NOTE — Progress Notes (Signed)
   [facility] Pharmacy Vaccination Visit  HPI: Hayden Hicks is a 43 y.o. male who presents to the [facility] pharmacy clinic for vaccine administration.  Referring ID Physician: Dr. Fleeta Rothman  Hepatitis B Lab Results  Component Value Date   HEPBSAB NEGATIVE 02/06/2008   Lab Results  Component Value Date   HEPBSAG NEGATVE 02/06/2008    Hepatitis C Lab Results  Component Value Date   HCVAB NEGATIVE 02/06/2008    Hepatitis A No results found for: HAV  Assessment & Plan: - Administered annual flu vaccine today during spouse's PrEP visit  - Patient tolerated well  Alan Geralds, PharmD, CPP, BCIDP, AAHIVP Clinical Pharmacist Practitioner Infectious Diseases Clinical Pharmacist [facility] 03/05/2024, 1:57 PM

## 2024-03-07 ENCOUNTER — Ambulatory Visit

## 2024-03-13 ENCOUNTER — Other Ambulatory Visit (HOSPITAL_COMMUNITY): Payer: Self-pay

## 2024-05-16 ENCOUNTER — Other Ambulatory Visit (HOSPITAL_BASED_OUTPATIENT_CLINIC_OR_DEPARTMENT_OTHER): Payer: Self-pay

## 2024-05-16 ENCOUNTER — Other Ambulatory Visit: Payer: Self-pay | Admitting: Pharmacist

## 2024-05-16 ENCOUNTER — Other Ambulatory Visit: Payer: Self-pay

## 2024-05-16 ENCOUNTER — Other Ambulatory Visit (HOSPITAL_COMMUNITY): Payer: Self-pay

## 2024-05-16 DIAGNOSIS — B20 Human immunodeficiency virus [HIV] disease: Secondary | ICD-10-CM

## 2024-05-16 MED ORDER — DOVATO 50-300 MG PO TABS
1.0000 | ORAL_TABLET | Freq: Every day | ORAL | 2 refills | Status: AC
  Filled 2024-05-16: qty 30, 30d supply, fill #0
  Filled 2024-06-06: qty 30, 30d supply, fill #1

## 2024-05-16 NOTE — Progress Notes (Signed)
 Specialty Pharmacy Initial Fill Coordination Note  Hayden Hicks is a 43 y.o. male contacted today regarding initial fill of specialty medication(s) Dolutegravir -lamiVUDine  (Dovato )   Patient requested Delivery   Delivery date: 05/20/24   Verified address: [address] [place] [address]   Medication will be filled on: 05/16/24   Patient is aware of $0 copayment.

## 2024-05-16 NOTE — Progress Notes (Signed)
 Specialty Pharmacy Initiation Note   Hayden Hicks is a 43 y.o. male who will be followed by the specialty pharmacy service for RxSp HIV    Review of administration, indication, effectiveness, safety, potential side effects, storage/disposable, and missed dose instructions occurred today for patient's specialty medication(s) Dolutegravir -lamiVUDine  (Dovato )     Patient/Caregiver did not have any additional questions or concerns.   Patient's therapy is appropriate to: Continue    Goals Addressed             This Visit's Progress    Achieve Undetectable HIV Viral Load < 20       Patient is on track. Patient will maintain adherence      Increase CD4 count until steady state       Patient is on track. Patient will maintain adherence      Maintain optimal adherence to therapy       Patient is not on track and improving. Patient will work on increased adherence         Alan JINNY Geralds Specialty Pharmacist

## 2024-05-19 ENCOUNTER — Other Ambulatory Visit (HOSPITAL_COMMUNITY): Payer: Self-pay

## 2024-05-19 MED ORDER — GABAPENTIN 400 MG PO CAPS
400.0000 mg | ORAL_CAPSULE | Freq: Three times a day (TID) | ORAL | 5 refills | Status: AC
  Filled 2024-05-19: qty 90, 30d supply, fill #0

## 2024-05-19 MED ORDER — ATORVASTATIN CALCIUM 20 MG PO TABS
20.0000 mg | ORAL_TABLET | Freq: Every day | ORAL | 5 refills | Status: AC
  Filled 2024-05-19: qty 30, 30d supply, fill #0
  Filled 2024-06-12: qty 30, 30d supply, fill #1

## 2024-06-05 ENCOUNTER — Other Ambulatory Visit: Payer: Self-pay

## 2024-06-06 ENCOUNTER — Other Ambulatory Visit: Payer: Self-pay

## 2024-06-10 ENCOUNTER — Other Ambulatory Visit: Payer: Self-pay

## 2024-06-12 ENCOUNTER — Other Ambulatory Visit: Payer: Self-pay

## 2024-06-12 ENCOUNTER — Other Ambulatory Visit: Payer: Self-pay | Admitting: Pharmacy Technician

## 2024-06-12 NOTE — Progress Notes (Signed)
 Specialty Pharmacy Refill Coordination Note  Hayden Hicks is a 44 y.o. male contacted today regarding refills of specialty medication(s) Dolutegravir -lamiVUDine  (Dovato )   Patient requested Delivery   Delivery date: 06/18/24   Verified address: [address]  [place]   Medication will be filled on: 06/17/24

## 2024-06-17 ENCOUNTER — Other Ambulatory Visit: Payer: Self-pay

## 2024-10-08 ENCOUNTER — Ambulatory Visit: Payer: Self-pay | Admitting: Infectious Disease
